# Patient Record
Sex: Female | Born: 1970 | Race: White | Hispanic: No | Marital: Married | Smoking: Never smoker
Health system: Southern US, Community
[De-identification: ages and names within clinical notes are randomized; demographics above are authoritative.]

## PROBLEM LIST (undated history)

## (undated) DIAGNOSIS — G43909 Migraine, unspecified, not intractable, without status migrainosus: Secondary | ICD-10-CM

## (undated) DIAGNOSIS — I493 Ventricular premature depolarization: Secondary | ICD-10-CM

## (undated) HISTORY — DX: Migraine, unspecified, not intractable, without status migrainosus: G43.909

## (undated) HISTORY — DX: Ventricular premature depolarization: I49.3

---
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
## 2000-08-17 ENCOUNTER — Encounter: Payer: Self-pay | Admitting: Internal Medicine

## 2000-08-17 ENCOUNTER — Encounter: Admission: RE | Admit: 2000-08-17 | Discharge: 2000-08-17 | Payer: Self-pay | Admitting: Internal Medicine

## 2000-10-09 ENCOUNTER — Ambulatory Visit (HOSPITAL_COMMUNITY): Admission: RE | Admit: 2000-10-09 | Discharge: 2000-10-09 | Payer: Self-pay | Admitting: Family Medicine

## 2000-10-10 ENCOUNTER — Encounter: Payer: Self-pay | Admitting: Family Medicine

## 2000-11-08 ENCOUNTER — Encounter: Payer: Self-pay | Admitting: Family Medicine

## 2000-11-08 ENCOUNTER — Ambulatory Visit (HOSPITAL_COMMUNITY): Admission: RE | Admit: 2000-11-08 | Discharge: 2000-11-08 | Payer: Self-pay | Admitting: Family Medicine

## 2001-01-07 ENCOUNTER — Ambulatory Visit (HOSPITAL_COMMUNITY): Admission: RE | Admit: 2001-01-07 | Discharge: 2001-01-07 | Payer: Self-pay | Admitting: Gastroenterology

## 2001-01-07 ENCOUNTER — Encounter (INDEPENDENT_AMBULATORY_CARE_PROVIDER_SITE_OTHER): Payer: Self-pay

## 2001-02-06 ENCOUNTER — Ambulatory Visit (HOSPITAL_COMMUNITY): Admission: RE | Admit: 2001-02-06 | Discharge: 2001-02-06 | Payer: Self-pay | Admitting: Gastroenterology

## 2001-02-06 ENCOUNTER — Encounter: Payer: Self-pay | Admitting: Gastroenterology

## 2001-03-28 ENCOUNTER — Other Ambulatory Visit: Admission: RE | Admit: 2001-03-28 | Discharge: 2001-03-28 | Payer: Self-pay | Admitting: Obstetrics and Gynecology

## 2001-09-17 ENCOUNTER — Other Ambulatory Visit: Admission: RE | Admit: 2001-09-17 | Discharge: 2001-09-17 | Payer: Self-pay | Admitting: Obstetrics and Gynecology

## 2002-09-25 ENCOUNTER — Other Ambulatory Visit: Admission: RE | Admit: 2002-09-25 | Discharge: 2002-09-25 | Payer: Self-pay | Admitting: Obstetrics and Gynecology

## 2003-03-31 ENCOUNTER — Inpatient Hospital Stay (HOSPITAL_COMMUNITY): Admission: AD | Admit: 2003-03-31 | Discharge: 2003-04-03 | Payer: Self-pay | Admitting: Obstetrics and Gynecology

## 2003-05-15 ENCOUNTER — Other Ambulatory Visit: Admission: RE | Admit: 2003-05-15 | Discharge: 2003-05-15 | Payer: Self-pay | Admitting: Obstetrics and Gynecology

## 2004-06-07 ENCOUNTER — Other Ambulatory Visit: Admission: RE | Admit: 2004-06-07 | Discharge: 2004-06-07 | Payer: Self-pay | Admitting: Obstetrics and Gynecology

## 2004-10-11 ENCOUNTER — Ambulatory Visit: Payer: Self-pay

## 2004-12-06 ENCOUNTER — Other Ambulatory Visit: Admission: RE | Admit: 2004-12-06 | Discharge: 2004-12-06 | Payer: Self-pay | Admitting: Obstetrics and Gynecology

## 2005-07-04 ENCOUNTER — Inpatient Hospital Stay (HOSPITAL_COMMUNITY): Admission: AD | Admit: 2005-07-04 | Discharge: 2005-07-04 | Payer: Self-pay | Admitting: Obstetrics and Gynecology

## 2005-07-06 ENCOUNTER — Inpatient Hospital Stay (HOSPITAL_COMMUNITY): Admission: AD | Admit: 2005-07-06 | Discharge: 2005-07-08 | Payer: Self-pay | Admitting: Obstetrics and Gynecology

## 2005-07-21 ENCOUNTER — Inpatient Hospital Stay (HOSPITAL_COMMUNITY): Admission: AD | Admit: 2005-07-21 | Discharge: 2005-07-21 | Payer: Self-pay | Admitting: Obstetrics and Gynecology

## 2006-10-10 ENCOUNTER — Ambulatory Visit: Payer: Self-pay | Admitting: Vascular Surgery

## 2007-03-06 ENCOUNTER — Ambulatory Visit (HOSPITAL_COMMUNITY): Admission: RE | Admit: 2007-03-06 | Discharge: 2007-03-06 | Payer: Self-pay | Admitting: Cardiology

## 2007-03-06 ENCOUNTER — Encounter: Payer: Self-pay | Admitting: Cardiology

## 2007-03-06 ENCOUNTER — Ambulatory Visit: Payer: Self-pay | Admitting: Cardiology

## 2007-09-10 ENCOUNTER — Ambulatory Visit (HOSPITAL_COMMUNITY): Admission: RE | Admit: 2007-09-10 | Discharge: 2007-09-10 | Payer: Self-pay | Admitting: Obstetrics and Gynecology

## 2007-10-09 ENCOUNTER — Ambulatory Visit (HOSPITAL_COMMUNITY): Admission: RE | Admit: 2007-10-09 | Discharge: 2007-10-09 | Payer: Self-pay | Admitting: Obstetrics and Gynecology

## 2008-03-02 ENCOUNTER — Inpatient Hospital Stay (HOSPITAL_COMMUNITY): Admission: RE | Admit: 2008-03-02 | Discharge: 2008-03-04 | Payer: Self-pay | Admitting: Obstetrics and Gynecology

## 2008-03-02 ENCOUNTER — Encounter (INDEPENDENT_AMBULATORY_CARE_PROVIDER_SITE_OTHER): Payer: Self-pay | Admitting: Obstetrics and Gynecology

## 2008-03-27 ENCOUNTER — Ambulatory Visit: Admission: RE | Admit: 2008-03-27 | Discharge: 2008-03-27 | Payer: Self-pay | Admitting: Obstetrics and Gynecology

## 2008-11-18 ENCOUNTER — Ambulatory Visit: Payer: Self-pay | Admitting: Vascular Surgery

## 2009-02-24 ENCOUNTER — Ambulatory Visit: Payer: Self-pay | Admitting: Vascular Surgery

## 2009-04-07 ENCOUNTER — Ambulatory Visit: Payer: Self-pay | Admitting: Vascular Surgery

## 2010-05-17 LAB — CBC
HCT: 29.6 % — ABNORMAL LOW (ref 36.0–46.0)
HCT: 40.2 % (ref 36.0–46.0)
Hemoglobin: 10.1 g/dL — ABNORMAL LOW (ref 12.0–15.0)
Hemoglobin: 13.7 g/dL (ref 12.0–15.0)
MCHC: 34.1 g/dL (ref 30.0–36.0)
MCHC: 34.3 g/dL (ref 30.0–36.0)
MCV: 97.3 fL (ref 78.0–100.0)
MCV: 97.4 fL (ref 78.0–100.0)
Platelets: 193 10*3/uL (ref 150–400)
Platelets: 255 10*3/uL (ref 150–400)
RBC: 3.04 MIL/uL — ABNORMAL LOW (ref 3.87–5.11)
RBC: 4.13 MIL/uL (ref 3.87–5.11)
RDW: 13.1 % (ref 11.5–15.5)
RDW: 13.3 % (ref 11.5–15.5)
WBC: 11.9 10*3/uL — ABNORMAL HIGH (ref 4.0–10.5)
WBC: 12.9 10*3/uL — ABNORMAL HIGH (ref 4.0–10.5)

## 2010-05-17 LAB — CCBB MATERNAL DONOR DRAW

## 2010-05-17 LAB — RPR: RPR Ser Ql: NONREACTIVE

## 2010-06-14 NOTE — Assessment & Plan Note (Signed)
OFFICE VISIT   Gartner, Sheena Williams  DOB:  [birth year]                                       11/18/2008  CHART#:[id number]   CHIEF COMPLAINT:  Pain in her right leg with venous pathology.   The patient is a 40 year old white female who I had seen in September of  2008.  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  These also extended up onto her  right labia.  These had resolved with each pregnancy.  She is now  approximately 8 months postpartum from her third child.  She reports  that initially immediately following delivery she had some improvement  but has now had worsening pain and swelling in her right posterior calf  extending down into her thigh.  She denies any history of deep venous  thrombosis or superficial thrombophlebitis.  She is not having any left  leg symptoms.   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  Does not smoke or drink alcohol.  She is married with two  children.   REVIEW OF SYSTEMS:  Positive for palpitations with PVCs, mitral valve  prolapse, otherwise negative.   PHYSICAL EXAM:  A well-developed, well-nourished white female appearing  her stated age of 70.  Her blood pressure is 117/84, pulse 81,  respirations 18.  Her dorsalis pedis pulses are 2+ bilaterally.  She  does not have any swelling today.  She does have an extensive small  varicosity in her right posterior thigh that runs the entire length from  her medial near the level of her labia down to the lateral calf at the  level just below the knee.  She reports tenderness over this area and  extending down into her calf and this has become progressive with  prolonged standing.   She underwent imaging by myself showing a small diameter great saphenous  vein with no evidence of reflux.  On imaging her saphenofemoral junction  I do not see any communication with the  varicosity of concern in her  saphenofemoral junction.  I discussed options with the patient.  She did  wear compression garments during her pregnancy which were panty style  and did give her some improvement.  She has not worn compression  garments following delivery.  She was fitted today with thigh-high 20-30  mmHg graduated compression stockings and will begin their use.  We will  see her again in 3 months to continue discussion regarding optimal  treatment.   Larina Earthly, M.D.  Electronically Signed   TFE/MEDQ  D:  11/18/2008  T:  11/19/2008  Job:  [id number]

## 2010-06-14 NOTE — Consult Note (Signed)
NEW PATIENT CONSULTATION   Sheena, Loree Williams  DOB:  [birth year]                                       10/10/2006  CHART#:[id number]   REFERRING PHYSICIAN:  Dario Guardian, MD   HISTORY OF PRESENT ILLNESS:  Sheena Williams presents today for  evaluation of bilateral venous pathology.  She is a very healthy, 40-  year-old white female, who during her first 2 pregnancies had issues  regarding labial varicosities.  These resolved after each pregnancy and  have not caused a great deal of difficulty.  She does report that she  does have some bulges in these areas with some swelling in the second  and third day of her menstrual cycle.  She reports this is a mild  nuisance only and is not causing her any specific discomfort.  She also  had what sounds like marked reticular varicosities in the posterior  thighs bilaterally at the time of her late pregnancy as well.  This  issue has completely resolved.  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  She reports that these occurred soon after  striking this with a softball as a teenager.  She does not have any  history of deep venous thrombosis or superficial thrombophlebitis.  Medical history is otherwise unremarkable.  She does have a history of  myocardial infarction and death at a young age for her father.  Mother  is significant for venous varicosities and peripheral vascular occlusive  disease.   SOCIAL HISTORY:  She is married with 2 children.  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  She does not smoke and has rare social  alcohol consumption.   REVIEW OF SYSTEMS:  Positive only for palpitations with PVCs, mitral  valve prolapse.   PHYSICAL EXAMINATION:  She is well-developed, well-nourished white  female, appearing her stated age of 51.  Weight is 125 pounds.  She is  65 inches tall.  Blood pressure is 104/66, pulse 64, respirations 16.  Dorsalis pedis pulses are 2+  bilaterally.  She does have reticular  varicosities over the lateral aspect of her thigh on the left.  She  reports that she does not have any labial varicosities currently, and I  did not examine this.  Her ankles are without swelling.   She underwent imaging of her saphenous vein by me with a screening  study, and these showed no evidence of dilatation of her saphenous veins  bilaterally.   I had a long discussion with Ms. Falotico.  I explained that with  subsequent pregnancies she will have continued bulging of these  varicosities later during her pregnancy.  I explained that this should  cause no risk and should resolve completely after delivery, as it has  each other time.  I explained the only treatment for these would be  sclerotherapy when she is not pregnant.  Apparently, she has seen an  outlying vein clinic; and they have recommended treatment with laser  ablation of her saphenous vein.  On cursory exam, I do not demonstrate  any gross reflux and she does not have enlarged saphenous veins.  I  explained that we are certainly much more conservative than this, since  she is not having any significant difficulty at this time, and would not  recommend such aggressive treatment to prevent potential  mild problems  with labial varicosities at the time of her delivery.  She was reassured  with this discussion and will see [place] again on an as needed basis.  I did  explain that if there was an issue regarding cosmetic involvement of the  reticular varicosities on the lateral aspect of her thigh, these could  be treated with sclerotherapy.  She is not concerned regarding this and  will see [place] again on an as needed basis.   Larina Earthly, M.D.  Electronically Signed   TFE/MEDQ  D:  10/10/2006  T:  10/11/2006  Job:  [id number]   cc:   Juluis Mire, M.D.  Dario Guardian, M.D.

## 2010-06-14 NOTE — Assessment & Plan Note (Signed)
OFFICE VISIT   Whitehair, Kristyana B  DOB:  12/16/1970                                       02/24/2009  CHART#:[id number]   The patient presents today for continued follow-up of her right leg  venous pathology.  I had seen her initially in September 2008 and  subsequently in October 2010.  She is now approximately 1 year  postpartum with her 3rd child.  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  She also reports this causes some difficulty with pain and  discomfort when squatting in household chores and childcare with  prolonged standing as well.  She also reports some continued labial  pressure and pain and swelling around her menstrual cycle.   Her past history and review of systems are otherwise unchanged.  She is  otherwise healthy.   PHYSICAL EXAMINATION:  She does have a have a tributary varicosity over  the area mentioned.  I feel that she has failed conservative treatment.  She does elevate when possible, does wear 20-30 mmHg compression  stockings and takes Advil for discomfort.  Despite this, she is still  having pain.  I explained the option of stab phlebectomy of her  reticular tributary varicosities and also the option of sclerotherapy.  I feel that her best result would be with sclerotherapy over these  areas.  She reports that the labial pressure is tolerable and is worse  with menstrual cycle.  I have recommended sclerotherapy and explained  the procedure, including a very slight risk of DVT and also some  possibility of staining over the area.  She understands and wishes to  proceed.     Larina Earthly, M.D.  Electronically Signed   TFE/MEDQ  D:   02/24/2009  T:  02/25/2009  Job:  [id number]   cc:   Dario Guardian, M.D.

## 2010-06-14 NOTE — Assessment & Plan Note (Signed)
OFFICE VISIT   Sheena Williams, Sheena Williams  DOB:  April 07, 1970                                       04/07/2009  CHART#:[id number]   The patient presents today for treatment of her painful reticular veins  on the posterior aspect of her right thigh.  She had a total of 6 mL of  0.3% polidocanol mixed 50% with CO2 air over the area from the lateral  aspect of her right popliteal space across the posterior thigh up to the  medial perineum.  She had no immediate complication and will be seen  again in one week for followup.     Larina Earthly, M.D.  Electronically Signed   TFE/MEDQ  D:  04/07/2009  T:  04/08/2009  Job:  [id number]

## 2010-06-17 NOTE — Procedures (Signed)
[facility]  Patient:    Sheena Williams, Sheena Williams Visit Number: [id number] MRN: [id number]          Service Type: END Location: ENDO Attending Physician:  Louie Bun Dictated by:   Everardo All Madilyn Fireman, M.D. Proc. Date: 01/07/01 Admit Date:  01/07/2001   CC:         Dario Guardian, M.D.   Procedure Report  PROCEDURE:  Colonoscopy with biopsy.  INDICATION FOR PROCEDURE:  Recurrent abdominal pain with a CT scan showing thickening of the cecum and terminal ileum and possibly sigmoid colon, suggestive of inflammatory bowel disease.  DESCRIPTION OF PROCEDURE:  The patient was placed in the left lateral decubitus position and placed on the pulse monitor with continuous low-flow oxygen delivered by nasal cannula.  She was sedated with 100 mg of IV Demerol and 10 mg of IV Versed.  The Olympus video colonoscope was inserted into the rectum and advanced to the cecum, confirmed by transillumination at McBurneys point and visualization of the ileocecal valve and on one or two occasions, I could see the appendiceal orifice at a distance.  The prep was good.  I could not get the scope to pass deeply into the cecum despite multiple torquing maneuvers, abdominal pressure, and multiple position changes including the prone position, and this hindered more detailed inspection of the cecum and intubation of the terminal ileum which I attempted from a distance but was unsuccessful in doing.  It appeared to me that the ileocecal valve was somewhat generous in appearance with some fine granularity, but this was of very questionable clinical significance.  I could not see the proximal side of the valve due to inability to intubate the cecum deeply, but the mucosa visualized in the cecum appeared normal other than a slightly generously sized ileocecal valve.  The ileocecal valve was biopsied.  The ascending, transverse, descending, sigmoid, and rectum all appeared normal with  no mucosal abnormalities, masses, polyps, or diverticula.  Specifically, there was no visible inflammation in the sigmoid colon where some thickening was suggested by CT.  The rectum likewise appeared normal, and retroflex view of the anus revealed on obvious internal hemorrhoids.  The colonoscope was then withdrawn, and the patient returned to the recovery room in stable condition. She tolerated the procedure well, and there were no immediate complications.  IMPRESSION:  No specific abnormality other than slightly enlarged ileocecal valve of very questionable significance with inspection of the base of the cecum somewhat limited by inability to advance the scope all the way into that area.  PLAN:  Given the periumbilical nature of her pain, we will consider a Meckels scan.  Will also await biopsy results.  Consider barium enema, small bowel series, etc. Dictated by:   Everardo All. Madilyn Fireman, M.D. Attending Physician:  Louie Bun DD:  01/07/01 TD:  01/07/01 Job: [id number]

## 2010-06-17 NOTE — H&P (Signed)
Sheena Williams, Sheena Williams                        ACCOUNT NO.:  [id number]   MEDICAL RECORD NO.:  [id number]                   PATIENT TYPE:  INP   LOCATION:  9168                                 FACILITY:  [facility]   PHYSICIAN:  Guy Sandifer. Arleta Creek, M.D.           DATE OF BIRTH:  Dec 07, 1970   DATE OF ADMISSION:  03/31/2003  DATE OF DISCHARGE:                                HISTORY & PHYSICAL   CHIEF COMPLAINT:  Decreased fetal movement.   HISTORY OF PRESENT ILLNESS:  This patient is a 40 year old married white  female, G1, P0, with an EDC of April 05, 2003, that is confirmed by early  examination and second trimester ultrasound, placing her at 39-3/7 weeks.  Pregnancy has been complicated by history of LEEP procedure.  She initially  had a low-lying placenta which was noted to have resolved on ultrasound.  Today she presented complaining of decreased fetal movement.  Nonstress test  had accelerations and no decelerations but did not quite meet criteria for  reactive nonstress test.  Therefore, biophysical profile was obtained.  A  vertex infant with an estimated fetal weight of 7 pounds 7 ounces at the  45th percentile was noted.  Amniotic fluid index was 15.8 in the 63rd  percentile.  Placenta is anterior and grade II.  However, biophysical  profile was consistent with a 2/8.  There were brief breathing motions noted  but none that were sustained.   After discussion of the options, she is being admitted for a two-stage  induction of labor.  She denies headache, vision change, epigastric pain,  vaginal bleeding, or rupture of membranes.   PAST MEDICAL HISTORY:  Per prenatal History and Physical.   PAST SURGICAL HISTORY:  Per prenatal History and Physical.   FAMILY HISTORY:  Per prenatal History and Physical.   SOCIAL HISTORY:  The patient denies tobacco, alcohol, or drug abuse.   MEDICATIONS:  Prenatal vitamins.   ALLERGIES:  TETANUS VACCINE leading to flu-like syndrome.   LABORATORY DATA:  Blood type O positive, Rh antibody screen negative,  toxoplasmosis titer negative, RPR nonreactive, rubella titer positive,  hepatitis B surface antigen negative. Cystic fibrosis screen negative.  Gonorrhea and Chlamydia culture negative, and normal AFP second trimester  screen.   PHYSICAL EXAMINATION:  VITAL SIGNS:  Height 5 feet 5-1/2 inches, weight 156  pounds.  Blood pressure 118/72.  LUNGS: Clear to auscultation.  HEART:  Regular rate and rhythm.  BREASTS:  Not examined.  ABDOMEN:  Gravid, soft, nontender.  No epigastric tenderness.  EXTREMITIES:  1+ edema.  PELVIC:  Cervix is 2 cm dilated, 30% effaced, -1 station, and vertex.  NEUROLOGIC:  Deep tendon reflexes 3+ without clonus.   IMPRESSION:  1. Intrauterine pregnancy at 39-3/7 weeks.  2. Nonreassuring biophysical profile.   PLAN:  Two-stage induction of labor.  Guy Sandifer Arleta Creek, M.D.    JET/MEDQ  D:  03/31/2003  T:  03/31/2003  Job:  [id number]

## 2010-07-29 ENCOUNTER — Other Ambulatory Visit: Payer: Self-pay | Admitting: Obstetrics and Gynecology

## 2010-07-29 DIAGNOSIS — Z1231 Encounter for screening mammogram for malignant neoplasm of breast: Secondary | ICD-10-CM

## 2010-08-08 ENCOUNTER — Ambulatory Visit
Admission: RE | Admit: 2010-08-08 | Discharge: 2010-08-08 | Disposition: A | Discharge status: Home / Self Care | Payer: 59 | Source: Ambulatory Visit | Attending: Obstetrics and Gynecology | Admitting: Obstetrics and Gynecology

## 2010-08-08 DIAGNOSIS — Z1231 Encounter for screening mammogram for malignant neoplasm of breast: Secondary | ICD-10-CM

## 2010-12-26 ENCOUNTER — Other Ambulatory Visit: Payer: Self-pay | Admitting: *Deleted

## 2010-12-26 DIAGNOSIS — I341 Nonrheumatic mitral (valve) prolapse: Secondary | ICD-10-CM

## 2011-01-06 ENCOUNTER — Ambulatory Visit (HOSPITAL_COMMUNITY)
Admission: RE | Admit: 2011-01-06 | Discharge: 2011-01-06 | Disposition: A | Discharge status: Home / Self Care | Payer: 59 | Source: Ambulatory Visit | Attending: Cardiology | Admitting: Cardiology

## 2011-01-06 DIAGNOSIS — I341 Nonrheumatic mitral (valve) prolapse: Secondary | ICD-10-CM

## 2011-01-06 DIAGNOSIS — I059 Rheumatic mitral valve disease, unspecified: Secondary | ICD-10-CM | POA: Insufficient documentation

## 2011-01-06 NOTE — Progress Notes (Signed)
  Echocardiogram 2D Echocardiogram has been performed.  Sheena Williams 01/06/2011, 9:39 AM

## 2011-05-25 ENCOUNTER — Other Ambulatory Visit (HOSPITAL_COMMUNITY): Payer: Self-pay | Admitting: Obstetrics and Gynecology

## 2011-05-25 DIAGNOSIS — E041 Nontoxic single thyroid nodule: Secondary | ICD-10-CM

## 2011-05-29 ENCOUNTER — Other Ambulatory Visit (HOSPITAL_COMMUNITY): Payer: 59

## 2011-05-30 ENCOUNTER — Ambulatory Visit (HOSPITAL_COMMUNITY)
Admission: RE | Admit: 2011-05-30 | Discharge: 2011-05-30 | Disposition: A | Discharge status: Home / Self Care | Payer: 59 | Source: Ambulatory Visit | Attending: Obstetrics and Gynecology | Admitting: Obstetrics and Gynecology

## 2011-05-30 DIAGNOSIS — E041 Nontoxic single thyroid nodule: Secondary | ICD-10-CM

## 2011-05-30 DIAGNOSIS — E049 Nontoxic goiter, unspecified: Secondary | ICD-10-CM | POA: Insufficient documentation

## 2011-06-30 ENCOUNTER — Encounter (INDEPENDENT_AMBULATORY_CARE_PROVIDER_SITE_OTHER): Payer: Self-pay | Admitting: General Surgery

## 2011-07-05 ENCOUNTER — Ambulatory Visit (INDEPENDENT_AMBULATORY_CARE_PROVIDER_SITE_OTHER): Payer: Commercial Managed Care - PPO | Admitting: Surgery

## 2011-07-05 ENCOUNTER — Encounter (INDEPENDENT_AMBULATORY_CARE_PROVIDER_SITE_OTHER): Payer: Self-pay | Admitting: Surgery

## 2011-07-05 VITALS — BP 108/72 | HR 82 | Temp 98.4°F | Resp 14 | Ht 65.5 in | Wt 133.2 lb

## 2011-07-05 DIAGNOSIS — E042 Nontoxic multinodular goiter: Secondary | ICD-10-CM

## 2011-07-05 NOTE — Progress Notes (Signed)
Chief Complaint  Patient presents with  . New Evaluation    thyroid nodules - referral from Dr. Richardean Chimera    HISTORY: Patient is a 41 year old white female referred by her gynecologist with newly diagnosed thyroid nodules. These were found on routine physical examination in April. Patient subsequently underwent thyroid ultrasound showing a multinodular thyroid gland of normal size. Largest nodules measured 1.2 cm and 1.4 cm. No other worrisome findings were identified. Thyroid function tests were obtained and a TSH level was normal at 1.007.  Patient had a history of thyroid cyst diagnosed approximately 10 years ago by ultrasound. She has never taken thyroid medication. She has had no prior head or neck surgery other than tonsillectomy. There is no family history of thyroid disease. There is no family history of other endocrinopathy.  Past Medical History  Diagnosis Date  . Migraines   . PVC's (premature ventricular contractions)      No current outpatient prescriptions on file.     Allergies  Allergen Reactions  . Tetanus Toxoids      Family History  Problem Relation Age of Onset  . Heart disease Father   . Cancer Maternal Aunt     Breast  . Cancer Maternal Grandmother     Breast  . Cancer Paternal Grandmother     lung     History   Social History  . Marital Status: Married    Spouse Name: N/A    Number of Children: N/A  . Years of Education: N/A   Social History Main Topics  . Smoking status: Never Smoker   . Smokeless tobacco: Never Used  . Alcohol Use: Yes     rarely  . Drug Use: No  . Sexually Active:    Other Topics Concern  . None   Social History Narrative  . None     REVIEW OF SYSTEMS - PERTINENT POSITIVES ONLY: Denies tremor. Denies mass. Denies pain. Denies palpitations.  EXAM: Filed Vitals:   07/05/11 0855  BP: 108/72  Pulse: 82  Temp: 98.4 F (36.9 C)  Resp: 14    HEENT: normocephalic; pupils equal and reactive; sclerae  clear; dentition good; mucous membranes moist NECK:  Palpable nodule left upper lobe approximately 1 cm, smooth, mobile, nontender; right lobe without palpable abnormality; symmetric on extension; no palpable anterior or posterior cervical lymphadenopathy; no supraclavicular masses; no tenderness CHEST: clear to auscultation bilaterally without rales, rhonchi, or wheezes CARDIAC: regular rate and rhythm without significant murmur; peripheral pulses are full EXT:  non-tender without edema; no deformity NEURO: no gross focal deficits; no sign of tremor   LABORATORY RESULTS: See [facility] (CHL-Epic) for most recent results   RADIOLOGY RESULTS: See [facility] (CHL-Epic) for most recent results   IMPRESSION: Bilateral thyroid nodules, clinically asymptomatic  PLAN: The patient and I discussed all of the above findings and issues. I think her risk of malignancy is quite low. I have recommended followup in 6 months with ultrasound and physical examination and TSH level. There is no current indication for surgical resection. Patient is in agreement and will return in 6 months for scheduled followup.  Velora Heckler, MD, FACS General & Endocrine Surgery [facility]   Visit Diagnoses: 1. Multinodular goiter (nontoxic)     Primary Care Physician: Allean Found, MD, MD  GYN:  Dr. Richardean Chimera

## 2011-07-05 NOTE — Patient Instructions (Signed)
Thyroid Diseases Your thyroid is a butterfly-shaped gland in your neck. It is located just above your collarbone. It is one of your endocrine glands, which make hormones. The thyroid helps set your metabolism. Metabolism is how your body gets energy from the foods you eat.  Millions of people have thyroid diseases. Women experience thyroid problems more often than men. In fact, overactive thyroid problems (hyperthyroidism) occur in 1% of all women. If you have a thyroid disease, your body may use energy more slowly or quickly than it should.  Thyroid problems also include an immune disease where your body reacts against your thyroid gland (called thyroiditis). A different problem involves lumps and bumps (called nodules) that develop in the gland. The nodules are usually, but not always, noncancerous. THE MOST COMMON THYROID PROBLEMS AND CAUSES ARE DISCUSSED BELOW There are many causes for thyroid problems. Treatment depends upon the exact diagnosis and includes trying to reset your body's metabolism to a normal rate. Hyperthyroidism Too much thyroid hormone from an overactive thyroid gland is called hyperthyroidism. In hyperthyroidism, the body's metabolism speeds up. One of the most frequent forms of hyperthyroidism is known as Graves' disease. Graves' disease tends to run in families. Although Graves' is thought to be caused by a problem with the immune system, the exact nature of the genetic problem is unknown. Hypothyroidism Too little thyroid hormone from an underactive thyroid gland is called hypothyroidism. In hypothyroidism, the body's metabolism is slowed. Several things can cause this condition. Most causes affect the thyroid gland directly and hurt its ability to make enough hormone.  Rarely, there may be a pituitary gland tumor (located near the base of the brain). The tumor can block the pituitary from producing thyroid-stimulating hormone (TSH). Your body makes TSH to stimulate the thyroid  to work properly. If the pituitary does not make enough TSH, the thyroid fails to make enough hormones needed for good health. Whether the problem is caused by thyroid conditions or by the pituitary gland, the result is that the thyroid is not making enough hormones. Hypothyroidism causes many physical and mental processes to become sluggish. The body consumes less oxygen and produces less body heat. Thyroid Nodules A thyroid nodule is a small swelling or lump in the thyroid gland. They are common. These nodules represent either a growth of thyroid tissue or a fluid-filled cyst. Both form a lump in the thyroid gland. Almost half of all people will have tiny thyroid nodules at some point in their lives. Typically, these are not noticeable until they become large and affect normal thyroid size. Larger nodules that are greater than a half inch across (about 1 centimeter) occur in about 5 percent of people. Although most nodules are not cancerous, people who have them should seek medical care to rule out cancer. Also, some thyroid nodules may produce too much thyroid hormone or become too large. Large nodules or a large gland can interfere with breathing or swallowing or may cause neck discomfort. Other problems Other thyroid problems include cancer and thyroiditis. Thyroiditis is a malfunction of the body's immune system. Normally, the immune system works to defend the body against infection and other problems. When the immune system is not working properly, it may mistakenly attack normal cells, tissues, and organs. Examples of autoimmune diseases are Hashimoto's thyroiditis (which causes low thyroid function) and Graves' disease (which causes excess thyroid function). SYMPTOMS  Symptoms vary greatly depending upon the exact type of problem with the thyroid. Hyperthyroidism-is when your thyroid is too   active and makes more thyroid hormone than your body needs. The most common cause is Graves' Disease. Too  much thyroid hormone can cause some or all of the following symptoms:  Anxiety.   Irritability.   Difficulty sleeping.   Fatigue.   A rapid or irregular heartbeat.   A fine tremor of your hands or fingers.   An increase in perspiration.   Sensitivity to heat.   Weight loss, despite normal food intake.   Brittle hair.   Enlargement of your thyroid gland (goiter).   Light menstrual periods.   Frequent bowel movements.  Graves' disease can specifically cause eye and skin problems. The skin problems involve reddening and swelling of the skin, often on your shins and on the top of your feet. Eye problems can include the following:  Excess tearing and sensation of grit or sand in either or both eyes.   Reddened or inflamed eyes.   Widening of the space between your eyelids.   Swelling of the lids and tissues around the eyes.   Light sensitivity.   Ulcers on the cornea.   Double vision.   Limited eye movements.   Blurred or reduced vision.  Hypothyroidism- is when your thyroid gland is not active enough. This is more common than hyperthyroidism. Symptoms can vary a lot depending of the severity of the hormone deficiency. Symptoms may develop over a long period of time and can include several of the following:  Fatigue.   Sluggishness.   Increased sensitivity to cold.   Constipation.   Pale, dry skin.   A puffy face.   Hoarse voice.   High blood cholesterol level.   Unexplained weight gain.   Muscle aches, tenderness and stiffness.   Pain, stiffness or swelling in your joints.   Muscle weakness.   Heavier than normal menstrual periods.   Brittle fingernails and hair.   Depression.  Thyroid Nodules - most do not cause signs or symptoms. Occasionally, some may become so large that you can feel or even see the swelling at the base of your neck. You may realize a lump or swelling is there when you are shaving or putting on makeup. Men might become  aware of a nodule when shirt collars suddenly feel too tight. Some nodules produce too much thyroid hormone. This can produce the same symptoms as hyperthyroidism (see above). Thyroid nodules are seldom cancerous. However, a nodule is more likely to be malignant (cancerous) if it:  Grows quickly or feels hard.   Causes you to become hoarse or to have trouble swallowing or breathing.   Causes enlarged lymph nodes under your jaw or in your neck.  DIAGNOSIS  Because there are so many possible thyroid conditions, your caregiver may ask for a number of tests. They will do this in order to narrow down the exact diagnosis. These tests can include:  Blood and antibody tests.   Special thyroid scans using small, safe amounts of radioactive iodine.   Ultrasound of the thyroid gland (particularly if there is a nodule or lump).   Biopsy. This is usually done with a special needle. A needle biopsy is a procedure to obtain a sample of cells from the thyroid. The tissue will be tested in a lab and examined under a microscope.  TREATMENT  Treatment depends on the exact diagnosis. Hyperthyroidism  Beta-blockers help relieve many of the symptoms.   Anti-thyroid medications prevent the thyroid from making excess hormones.   Radioactive iodine treatment can destroy overactive thyroid   cells. The iodine can permanently decrease the amount of hormone produced.   Surgery to remove the thyroid gland.   Treatments for eye problems that come from Graves' disease also include medications and special eye surgery, if felt to be appropriate.  Hypothyroidism Thyroid replacement with levothyroxine is the mainstay of treatment. Treatment with thyroid replacement is usually lifelong and will require monitoring and adjustment from time to time. Thyroid Nodules  Watchful waiting. If a small nodule causes no symptoms or signs of cancer on biopsy, then no treatment may be chosen at first. Re-exam and re-checking blood  tests would be the recommended follow-up.   Anti-thyroid medications or radioactive iodine treatment may be recommended if the nodules produce too much thyroid hormone (see Treatment for Hyperthyroidism above).   Alcohol ablation. Injections of small amounts of ethyl alcohol (ethanol) can cause a non-cancerous nodule to shrink in size.   Surgery (see Treatment for Hyperthyroidism above).  HOME CARE INSTRUCTIONS   Take medications as instructed.   Follow through on recommended testing.  SEEK MEDICAL CARE IF:   You feel that you are developing symptoms of Hyperthyroidism or Hypothyroidism as described above.   You develop a new lump/nodule in the neck/thyroid area that you had not noticed before.   You feel that you are having side effects from medicines prescribed.   You develop trouble breathing or swallowing.  SEEK IMMEDIATE MEDICAL CARE IF:   You develop a fever of 102 F (38.9 C) or higher.   You develop severe sweating.   You develop palpitations and/or rapid heart beat.   You develop shortness of breath.   You develop nausea and vomiting.   You develop extreme shakiness.   You develop agitation.   You develop lightheadedness or have a fainting episode.  Document Released: 11/13/2006 Document Revised: 01/05/2011 Document Reviewed: 11/13/2006 [employer] Patient Information 2012 [employer]. 

## 2011-07-24 ENCOUNTER — Other Ambulatory Visit: Payer: Self-pay | Admitting: Obstetrics and Gynecology

## 2011-07-24 DIAGNOSIS — Z1231 Encounter for screening mammogram for malignant neoplasm of breast: Secondary | ICD-10-CM

## 2011-07-28 ENCOUNTER — Encounter (INDEPENDENT_AMBULATORY_CARE_PROVIDER_SITE_OTHER): Payer: Self-pay

## 2011-08-16 ENCOUNTER — Ambulatory Visit: Payer: 59

## 2011-08-18 ENCOUNTER — Ambulatory Visit
Admission: RE | Admit: 2011-08-18 | Discharge: 2011-08-18 | Disposition: A | Discharge status: Home / Self Care | Payer: 59 | Source: Ambulatory Visit | Attending: Obstetrics and Gynecology | Admitting: Obstetrics and Gynecology

## 2011-08-18 DIAGNOSIS — Z1231 Encounter for screening mammogram for malignant neoplasm of breast: Secondary | ICD-10-CM

## 2011-12-01 ENCOUNTER — Telehealth (INDEPENDENT_AMBULATORY_CARE_PROVIDER_SITE_OTHER): Payer: Self-pay

## 2011-12-01 NOTE — Telephone Encounter (Signed)
Lab slip reminder mailed to pt .

## 2012-01-02 ENCOUNTER — Ambulatory Visit (HOSPITAL_COMMUNITY)
Admission: RE | Admit: 2012-01-02 | Discharge: 2012-01-02 | Disposition: A | Discharge status: Home / Self Care | Payer: 59 | Source: Ambulatory Visit | Attending: Surgery | Admitting: Surgery

## 2012-01-02 DIAGNOSIS — E042 Nontoxic multinodular goiter: Secondary | ICD-10-CM | POA: Insufficient documentation

## 2012-01-04 ENCOUNTER — Other Ambulatory Visit (HOSPITAL_COMMUNITY): Payer: 59

## 2012-01-17 ENCOUNTER — Encounter (INDEPENDENT_AMBULATORY_CARE_PROVIDER_SITE_OTHER): Payer: Self-pay | Admitting: Surgery

## 2012-01-17 ENCOUNTER — Ambulatory Visit (INDEPENDENT_AMBULATORY_CARE_PROVIDER_SITE_OTHER): Payer: Commercial Managed Care - PPO | Admitting: Surgery

## 2012-01-17 VITALS — BP 108/66 | HR 72 | Temp 97.4°F | Resp 18 | Ht 65.5 in | Wt 133.2 lb

## 2012-01-17 DIAGNOSIS — E042 Nontoxic multinodular goiter: Secondary | ICD-10-CM

## 2012-01-17 NOTE — Progress Notes (Signed)
General Surgery [facility]  Visit Diagnoses: 1. Multinodular goiter (nontoxic)     HISTORY: Patient is a 41 year old white female evaluated in the summer of 2013 for newly diagnosed thyroid nodules. At my request she had a followup ultrasound performed on 01/02/2012. This shows a normal sized thyroid gland containing multiple small nodules. The dominant nodule in the right lobe is complex and measures 1.3 cm. The dominant nodule in the left lobe is primarily cystic in measures 1.2 cm. Both are stable. No worrisome findings are identified.  PERTINENT REVIEW OF SYSTEMS: Denies changes in self exam. Denies palpitations. Denies tremor. Denies compressive symptoms. Denies lymphadenopathy. Denies pain.  EXAM: HEENT: normocephalic; pupils equal and reactive; sclerae clear; dentition good; mucous membranes moist NECK:  Palpable smooth firm nodule mid the inferior portion of left thyroid lobe, mobile with swallowing; right lobe nodular without discrete or dominant mass; symmetric on extension; no palpable anterior or posterior cervical lymphadenopathy; no supraclavicular masses; no tenderness CHEST: clear to auscultation bilaterally without rales, rhonchi, or wheezes CARDIAC: regular rate and rhythm without significant murmur; peripheral pulses are full EXT:  non-tender without edema; no deformity NEURO: no gross focal deficits; no sign of tremor   IMPRESSION: Bilateral thyroid nodules, clinically stable  PLAN: Patient is reassured that her examination is benign. Nodules appear stable by ultrasound. We will repeat a thyroid ultrasound in one year. She has had a TSH level performed at her primary care physician's office. She will forward that result of this to include in her records.  Patient will return for physical examination in one year following her ultrasound study.  Velora Heckler, MD, [facility] Office: [phone]

## 2012-01-17 NOTE — Patient Instructions (Signed)

## 2012-05-27 ENCOUNTER — Other Ambulatory Visit: Payer: Self-pay | Admitting: Dermatology

## 2013-01-01 ENCOUNTER — Other Ambulatory Visit: Payer: 59

## 2013-01-13 ENCOUNTER — Encounter: Payer: Self-pay | Admitting: Interventional Cardiology

## 2013-01-13 ENCOUNTER — Ambulatory Visit (INDEPENDENT_AMBULATORY_CARE_PROVIDER_SITE_OTHER): Payer: 59 | Admitting: Interventional Cardiology

## 2013-01-13 VITALS — BP 118/84 | HR 65 | Ht 65.0 in | Wt 130.0 lb

## 2013-01-13 DIAGNOSIS — I4949 Other premature depolarization: Secondary | ICD-10-CM

## 2013-01-13 DIAGNOSIS — I493 Ventricular premature depolarization: Secondary | ICD-10-CM | POA: Insufficient documentation

## 2013-01-13 DIAGNOSIS — I471 Supraventricular tachycardia: Secondary | ICD-10-CM | POA: Insufficient documentation

## 2013-01-13 NOTE — Patient Instructions (Signed)
Your physician recommends that you continue on your current medications as directed. Please refer to the Current Medication list given to you today.   Your physician recommends that you schedule a follow-up appointment as needed  

## 2013-01-13 NOTE — Progress Notes (Addendum)
Patient ID: Sheena Williams, female   DOB: Nov 29, 1970, 42 y.o.   MRN: [id number]   Date: 01/13/2013 ID: Sheena Williams, DOB 1970/10/16, MRN [id number] PCP: Allean Found, MD  Reason: Palpitations  ASSESSMENT;  1. Premature ventricular complexes, previously documented, increase frequency recently 2. "History of mitral valve prolapse" without echo documentation of the same  PLAN:  1. Reassurance with reevaluation only if symptoms persist or worsen. 2. Check electrolyte levels.   SUBJECTIVE: Sheena Williams is a 42 y.o. female who is having increased palpitations. There been previously evaluated and found to be premature ventricular contractions. Over the past 2 weeks they have become distracting. EKG performed at work demonstrated frequent uniform ventricular ectopy. She is up and moving around the ectopy subsides. She has not had syncope or lightheadedness. She feels that there is a diagnosis of "mitral valve prolapse". 2 prior echoes and not demonstrated echocardiographic evidence of prolapse but rather flattened mitral valve closure. No significant MR is noted. She denies chest pain. No orthopnea PND. She has never had sustained tachycardia.   Allergies  Allergen Reactions  . Tetanus Toxoids Other (See Comments)    High fever for 48 hours.    No current outpatient prescriptions on file prior to visit.   No current facility-administered medications on file prior to visit.    Past Medical History  Diagnosis Date  . Migraines   . PVC's (premature ventricular contractions)     Past Surgical History  Procedure Laterality Date  . Tonsillectomy  1993    History   Social History  . Marital Status: Married    Spouse Name: N/A    Number of Children: N/A  . Years of Education: N/A   Occupational History  . Not on file.   Social History Main Topics  . Smoking status: Never Smoker   . Smokeless tobacco: Never Used  . Alcohol Use: Yes     Comment: rarely  .  Drug Use: No  . Sexual Activity: Not on file   Other Topics Concern  . Not on file   Social History Narrative  . No narrative on file    Family History  Problem Relation Age of Onset  . Heart disease Father   . Cancer Maternal Aunt     Breast  . Cancer Maternal Grandmother     Breast  . Cancer Paternal Grandmother     lung    ROS: Resting well. Denies stress and anxiety. External sources of catecholamines. Denies caffeine use. Is no smoke or drink. No edema. Appetite is been stable. No wheezing or. Other systems negative for complaints.  OBJECTIVE: BP 118/84  Pulse 65  Ht 5\' 5"  (1.651 m)  Wt 130 lb (58.968 kg)  BMI 21.63 kg/m2,  General: No acute distress, healthy appearing HEENT: normal without jaundice or pallor Neck: JVD flat. Carotids absent Chest: Clear Cardiac: Murmur: Absent. Gallop: Absent. Rhythm: Irregular due to premature beats. Other: Normal Abdomen: Bruit: Normal. Pulsation: Normal Extremities: Edema: Absent. Pulses: 2+ Neuro: Normal Psych: Normal  ECG: Normal. One PVC noted

## 2013-01-14 ENCOUNTER — Telehealth: Payer: Self-pay

## 2013-01-14 DIAGNOSIS — I493 Ventricular premature depolarization: Secondary | ICD-10-CM

## 2013-01-14 NOTE — Telephone Encounter (Signed)
Message copied by Jarvis Newcomer on Tue Jan 14, 2013  8:25 AM ------      Message from: Verdis Prime      Created: Mon Jan 13, 2013  6:04 PM       I saw her for PVC's.      She needs a BMET and magnesium level to finish w/u of PVC's ------

## 2013-01-20 ENCOUNTER — Other Ambulatory Visit: Payer: Self-pay

## 2013-01-20 DIAGNOSIS — Z1231 Encounter for screening mammogram for malignant neoplasm of breast: Secondary | ICD-10-CM

## 2013-01-24 ENCOUNTER — Ambulatory Visit
Admission: RE | Admit: 2013-01-24 | Discharge: 2013-01-24 | Disposition: A | Discharge status: Home / Self Care | Payer: 59 | Source: Ambulatory Visit

## 2013-01-24 DIAGNOSIS — Z1231 Encounter for screening mammogram for malignant neoplasm of breast: Secondary | ICD-10-CM

## 2013-06-17 ENCOUNTER — Other Ambulatory Visit (HOSPITAL_COMMUNITY): Payer: Self-pay | Admitting: Obstetrics and Gynecology

## 2013-06-17 DIAGNOSIS — E041 Nontoxic single thyroid nodule: Secondary | ICD-10-CM

## 2013-06-19 ENCOUNTER — Ambulatory Visit (HOSPITAL_COMMUNITY): Payer: 59

## 2013-06-19 ENCOUNTER — Ambulatory Visit (HOSPITAL_COMMUNITY)
Admission: RE | Admit: 2013-06-19 | Discharge: 2013-06-19 | Disposition: A | Discharge status: Home / Self Care | Payer: 59 | Source: Ambulatory Visit | Attending: Obstetrics and Gynecology | Admitting: Obstetrics and Gynecology

## 2013-06-19 DIAGNOSIS — E042 Nontoxic multinodular goiter: Secondary | ICD-10-CM | POA: Insufficient documentation

## 2013-06-19 DIAGNOSIS — E041 Nontoxic single thyroid nodule: Secondary | ICD-10-CM | POA: Insufficient documentation

## 2013-12-22 ENCOUNTER — Other Ambulatory Visit: Payer: Self-pay

## 2013-12-22 DIAGNOSIS — Z1231 Encounter for screening mammogram for malignant neoplasm of breast: Secondary | ICD-10-CM

## 2014-01-26 ENCOUNTER — Ambulatory Visit
Admission: RE | Admit: 2014-01-26 | Discharge: 2014-01-26 | Disposition: A | Discharge status: Home / Self Care | Payer: 59 | Source: Ambulatory Visit

## 2014-01-26 DIAGNOSIS — Z1231 Encounter for screening mammogram for malignant neoplasm of breast: Secondary | ICD-10-CM

## 2014-06-15 ENCOUNTER — Other Ambulatory Visit: Payer: Self-pay | Admitting: Obstetrics and Gynecology

## 2014-06-16 ENCOUNTER — Other Ambulatory Visit (HOSPITAL_COMMUNITY): Payer: Self-pay | Admitting: Obstetrics and Gynecology

## 2014-06-16 DIAGNOSIS — E049 Nontoxic goiter, unspecified: Secondary | ICD-10-CM

## 2014-06-16 LAB — CYTOLOGY - PAP

## 2014-06-18 ENCOUNTER — Ambulatory Visit (HOSPITAL_COMMUNITY): Payer: 59

## 2014-06-18 ENCOUNTER — Ambulatory Visit (HOSPITAL_COMMUNITY)
Admission: RE | Admit: 2014-06-18 | Discharge: 2014-06-18 | Disposition: A | Discharge status: Home / Self Care | Payer: 59 | Source: Ambulatory Visit | Attending: Obstetrics and Gynecology | Admitting: Obstetrics and Gynecology

## 2014-06-18 DIAGNOSIS — E049 Nontoxic goiter, unspecified: Secondary | ICD-10-CM

## 2014-06-18 DIAGNOSIS — E042 Nontoxic multinodular goiter: Secondary | ICD-10-CM | POA: Insufficient documentation

## 2014-12-17 ENCOUNTER — Other Ambulatory Visit: Payer: Self-pay

## 2014-12-17 DIAGNOSIS — Z1231 Encounter for screening mammogram for malignant neoplasm of breast: Secondary | ICD-10-CM

## 2015-01-28 ENCOUNTER — Ambulatory Visit
Admission: RE | Admit: 2015-01-28 | Discharge: 2015-01-28 | Disposition: A | Discharge status: Home / Self Care | Payer: 59 | Source: Ambulatory Visit

## 2015-01-28 DIAGNOSIS — Z1231 Encounter for screening mammogram for malignant neoplasm of breast: Secondary | ICD-10-CM

## 2015-02-03 ENCOUNTER — Other Ambulatory Visit: Payer: Self-pay | Admitting: Obstetrics and Gynecology

## 2015-02-03 DIAGNOSIS — D225 Melanocytic nevi of trunk: Secondary | ICD-10-CM | POA: Diagnosis not present

## 2015-02-03 DIAGNOSIS — Z86018 Personal history of other benign neoplasm: Secondary | ICD-10-CM | POA: Diagnosis not present

## 2015-02-03 DIAGNOSIS — D1801 Hemangioma of skin and subcutaneous tissue: Secondary | ICD-10-CM | POA: Diagnosis not present

## 2015-02-03 DIAGNOSIS — R928 Other abnormal and inconclusive findings on diagnostic imaging of breast: Secondary | ICD-10-CM

## 2015-02-03 DIAGNOSIS — D485 Neoplasm of uncertain behavior of skin: Secondary | ICD-10-CM | POA: Diagnosis not present

## 2015-02-04 ENCOUNTER — Ambulatory Visit
Admission: RE | Admit: 2015-02-04 | Discharge: 2015-02-04 | Disposition: A | Discharge status: Home / Self Care | Payer: 59 | Source: Ambulatory Visit | Attending: Obstetrics and Gynecology | Admitting: Obstetrics and Gynecology

## 2015-02-04 DIAGNOSIS — R922 Inconclusive mammogram: Secondary | ICD-10-CM | POA: Diagnosis not present

## 2015-02-04 DIAGNOSIS — N6489 Other specified disorders of breast: Secondary | ICD-10-CM | POA: Diagnosis not present

## 2015-02-04 DIAGNOSIS — R928 Other abnormal and inconclusive findings on diagnostic imaging of breast: Secondary | ICD-10-CM

## 2015-04-26 DIAGNOSIS — Z0001 Encounter for general adult medical examination with abnormal findings: Secondary | ICD-10-CM | POA: Diagnosis not present

## 2015-04-26 DIAGNOSIS — R6889 Other general symptoms and signs: Secondary | ICD-10-CM | POA: Diagnosis not present

## 2015-04-26 DIAGNOSIS — J101 Influenza due to other identified influenza virus with other respiratory manifestations: Secondary | ICD-10-CM | POA: Diagnosis not present

## 2015-04-26 DIAGNOSIS — Z13220 Encounter for screening for lipoid disorders: Secondary | ICD-10-CM | POA: Diagnosis not present

## 2015-04-26 MED FILL — OSELTAMIVIR PHOS 75 MG CAP: 75 | 5 days supply | Qty: 10 | Fill #0

## 2015-06-17 DIAGNOSIS — Z01419 Encounter for gynecological examination (general) (routine) without abnormal findings: Secondary | ICD-10-CM | POA: Diagnosis not present

## 2015-06-17 DIAGNOSIS — Z6821 Body mass index (BMI) 21.0-21.9, adult: Secondary | ICD-10-CM | POA: Diagnosis not present

## 2015-07-07 DIAGNOSIS — H5213 Myopia, bilateral: Secondary | ICD-10-CM | POA: Diagnosis not present

## 2015-11-15 DIAGNOSIS — B349 Viral infection, unspecified: Secondary | ICD-10-CM | POA: Diagnosis not present

## 2015-11-23 MED FILL — VENTOLIN HFA 90 MCG INHALER: 108 (90 BAS | 25 days supply | Qty: 18 | Fill #0

## 2015-12-16 DIAGNOSIS — L309 Dermatitis, unspecified: Secondary | ICD-10-CM | POA: Diagnosis not present

## 2016-02-11 ENCOUNTER — Other Ambulatory Visit: Payer: Self-pay | Admitting: Obstetrics and Gynecology

## 2016-02-11 DIAGNOSIS — Z23 Encounter for immunization: Secondary | ICD-10-CM | POA: Diagnosis not present

## 2016-02-11 DIAGNOSIS — D485 Neoplasm of uncertain behavior of skin: Secondary | ICD-10-CM | POA: Diagnosis not present

## 2016-02-11 DIAGNOSIS — L821 Other seborrheic keratosis: Secondary | ICD-10-CM | POA: Diagnosis not present

## 2016-02-11 DIAGNOSIS — Z1231 Encounter for screening mammogram for malignant neoplasm of breast: Secondary | ICD-10-CM

## 2016-02-11 DIAGNOSIS — D2271 Melanocytic nevi of right lower limb, including hip: Secondary | ICD-10-CM | POA: Diagnosis not present

## 2016-02-11 DIAGNOSIS — Z86018 Personal history of other benign neoplasm: Secondary | ICD-10-CM | POA: Diagnosis not present

## 2016-02-11 DIAGNOSIS — D2272 Melanocytic nevi of left lower limb, including hip: Secondary | ICD-10-CM | POA: Diagnosis not present

## 2016-02-11 DIAGNOSIS — D225 Melanocytic nevi of trunk: Secondary | ICD-10-CM | POA: Diagnosis not present

## 2016-02-11 DIAGNOSIS — D1800 Hemangioma unspecified site: Secondary | ICD-10-CM | POA: Diagnosis not present

## 2016-02-11 DIAGNOSIS — D1801 Hemangioma of skin and subcutaneous tissue: Secondary | ICD-10-CM | POA: Diagnosis not present

## 2016-03-07 DIAGNOSIS — J309 Allergic rhinitis, unspecified: Secondary | ICD-10-CM | POA: Diagnosis not present

## 2016-03-07 MED FILL — AZELASTINE HCL 137 MCG SPRY: 0.1 | 30 days supply | Qty: 30 | Fill #0

## 2016-03-09 ENCOUNTER — Ambulatory Visit
Admission: RE | Admit: 2016-03-09 | Discharge: 2016-03-09 | Disposition: A | Discharge status: Home / Self Care | Payer: 59 | Source: Ambulatory Visit | Attending: Obstetrics and Gynecology | Admitting: Obstetrics and Gynecology

## 2016-03-09 DIAGNOSIS — Z1231 Encounter for screening mammogram for malignant neoplasm of breast: Secondary | ICD-10-CM | POA: Diagnosis not present

## 2016-04-05 DIAGNOSIS — Z13220 Encounter for screening for lipoid disorders: Secondary | ICD-10-CM | POA: Diagnosis not present

## 2016-04-05 DIAGNOSIS — E041 Nontoxic single thyroid nodule: Secondary | ICD-10-CM | POA: Diagnosis not present

## 2016-04-05 DIAGNOSIS — R000 Tachycardia, unspecified: Secondary | ICD-10-CM | POA: Diagnosis not present

## 2016-04-05 DIAGNOSIS — Z8249 Family history of ischemic heart disease and other diseases of the circulatory system: Secondary | ICD-10-CM | POA: Diagnosis not present

## 2016-06-20 DIAGNOSIS — Z6821 Body mass index (BMI) 21.0-21.9, adult: Secondary | ICD-10-CM | POA: Diagnosis not present

## 2016-06-20 DIAGNOSIS — Z01419 Encounter for gynecological examination (general) (routine) without abnormal findings: Secondary | ICD-10-CM | POA: Diagnosis not present

## 2016-06-21 ENCOUNTER — Other Ambulatory Visit (HOSPITAL_COMMUNITY): Payer: Self-pay | Admitting: Obstetrics and Gynecology

## 2016-06-21 DIAGNOSIS — Z8639 Personal history of other endocrine, nutritional and metabolic disease: Secondary | ICD-10-CM

## 2016-06-27 ENCOUNTER — Ambulatory Visit (HOSPITAL_COMMUNITY): Payer: 59

## 2016-07-24 ENCOUNTER — Ambulatory Visit (HOSPITAL_COMMUNITY)
Admission: RE | Admit: 2016-07-24 | Discharge: 2016-07-24 | Disposition: A | Discharge status: Home / Self Care | Payer: 59 | Source: Ambulatory Visit | Attending: Obstetrics and Gynecology | Admitting: Obstetrics and Gynecology

## 2016-07-24 DIAGNOSIS — E042 Nontoxic multinodular goiter: Secondary | ICD-10-CM | POA: Insufficient documentation

## 2016-07-24 DIAGNOSIS — E041 Nontoxic single thyroid nodule: Secondary | ICD-10-CM | POA: Diagnosis not present

## 2016-07-24 DIAGNOSIS — Z8639 Personal history of other endocrine, nutritional and metabolic disease: Secondary | ICD-10-CM

## 2016-08-30 DIAGNOSIS — H5213 Myopia, bilateral: Secondary | ICD-10-CM | POA: Diagnosis not present

## 2016-09-22 DIAGNOSIS — S335XXA Sprain of ligaments of lumbar spine, initial encounter: Secondary | ICD-10-CM | POA: Diagnosis not present

## 2016-09-22 MED FILL — NAPROXEN 500 MG TABLET: 500 | 30 days supply | Qty: 60 | Fill #0

## 2016-09-22 MED FILL — CYCLOBENZAPRINE 10 MG TAB: 10 | 10 days supply | Qty: 30 | Fill #0

## 2016-09-28 DIAGNOSIS — M549 Dorsalgia, unspecified: Secondary | ICD-10-CM | POA: Diagnosis not present

## 2016-09-28 MED FILL — predniSONE 50 MG TABS: 50 | 5 days supply | Qty: 5 | Fill #0

## 2016-09-29 ENCOUNTER — Ambulatory Visit (HOSPITAL_COMMUNITY)
Admission: RE | Admit: 2016-09-29 | Discharge: 2016-09-29 | Disposition: A | Discharge status: Home / Self Care | Payer: 59 | Source: Ambulatory Visit | Attending: Family Medicine | Admitting: Family Medicine

## 2016-09-29 ENCOUNTER — Other Ambulatory Visit (HOSPITAL_COMMUNITY): Payer: Self-pay | Admitting: Family Medicine

## 2016-09-29 DIAGNOSIS — M545 Low back pain, unspecified: Secondary | ICD-10-CM

## 2017-02-06 ENCOUNTER — Other Ambulatory Visit: Payer: Self-pay | Admitting: Obstetrics and Gynecology

## 2017-02-06 DIAGNOSIS — Z139 Encounter for screening, unspecified: Secondary | ICD-10-CM

## 2017-02-13 DIAGNOSIS — D225 Melanocytic nevi of trunk: Secondary | ICD-10-CM | POA: Diagnosis not present

## 2017-02-13 DIAGNOSIS — D2271 Melanocytic nevi of right lower limb, including hip: Secondary | ICD-10-CM | POA: Diagnosis not present

## 2017-02-13 DIAGNOSIS — D2272 Melanocytic nevi of left lower limb, including hip: Secondary | ICD-10-CM | POA: Diagnosis not present

## 2017-02-13 DIAGNOSIS — Z23 Encounter for immunization: Secondary | ICD-10-CM | POA: Diagnosis not present

## 2017-02-13 DIAGNOSIS — L570 Actinic keratosis: Secondary | ICD-10-CM | POA: Diagnosis not present

## 2017-02-13 DIAGNOSIS — I781 Nevus, non-neoplastic: Secondary | ICD-10-CM | POA: Diagnosis not present

## 2017-02-13 DIAGNOSIS — Z86018 Personal history of other benign neoplasm: Secondary | ICD-10-CM | POA: Diagnosis not present

## 2017-02-13 DIAGNOSIS — L821 Other seborrheic keratosis: Secondary | ICD-10-CM | POA: Diagnosis not present

## 2017-02-13 DIAGNOSIS — D1800 Hemangioma unspecified site: Secondary | ICD-10-CM | POA: Diagnosis not present

## 2017-03-08 DIAGNOSIS — D2261 Melanocytic nevi of right upper limb, including shoulder: Secondary | ICD-10-CM | POA: Diagnosis not present

## 2017-03-08 DIAGNOSIS — D485 Neoplasm of uncertain behavior of skin: Secondary | ICD-10-CM | POA: Diagnosis not present

## 2017-03-08 DIAGNOSIS — L570 Actinic keratosis: Secondary | ICD-10-CM | POA: Diagnosis not present

## 2017-03-12 ENCOUNTER — Ambulatory Visit
Admission: RE | Admit: 2017-03-12 | Discharge: 2017-03-12 | Disposition: A | Discharge status: Home / Self Care | Payer: 59 | Source: Ambulatory Visit | Attending: Obstetrics and Gynecology | Admitting: Obstetrics and Gynecology

## 2017-03-12 DIAGNOSIS — Z139 Encounter for screening, unspecified: Secondary | ICD-10-CM

## 2017-03-12 DIAGNOSIS — Z1231 Encounter for screening mammogram for malignant neoplasm of breast: Secondary | ICD-10-CM | POA: Diagnosis not present

## 2017-04-10 DIAGNOSIS — M25551 Pain in right hip: Secondary | ICD-10-CM | POA: Diagnosis not present

## 2017-04-10 DIAGNOSIS — M7631 Iliotibial band syndrome, right leg: Secondary | ICD-10-CM | POA: Diagnosis not present

## 2017-06-05 DIAGNOSIS — R194 Change in bowel habit: Secondary | ICD-10-CM | POA: Diagnosis not present

## 2017-06-05 DIAGNOSIS — Z0000 Encounter for general adult medical examination without abnormal findings: Secondary | ICD-10-CM | POA: Diagnosis not present

## 2017-06-05 DIAGNOSIS — Z13220 Encounter for screening for lipoid disorders: Secondary | ICD-10-CM | POA: Diagnosis not present

## 2017-06-13 DIAGNOSIS — K580 Irritable bowel syndrome with diarrhea: Secondary | ICD-10-CM | POA: Diagnosis not present

## 2017-06-21 DIAGNOSIS — Z6821 Body mass index (BMI) 21.0-21.9, adult: Secondary | ICD-10-CM | POA: Diagnosis not present

## 2017-06-21 DIAGNOSIS — Z01419 Encounter for gynecological examination (general) (routine) without abnormal findings: Secondary | ICD-10-CM | POA: Diagnosis not present

## 2017-06-28 ENCOUNTER — Other Ambulatory Visit (HOSPITAL_COMMUNITY): Payer: Self-pay | Admitting: Obstetrics and Gynecology

## 2017-06-28 DIAGNOSIS — Z803 Family history of malignant neoplasm of breast: Secondary | ICD-10-CM

## 2017-08-08 ENCOUNTER — Ambulatory Visit (HOSPITAL_COMMUNITY)
Admission: RE | Admit: 2017-08-08 | Discharge: 2017-08-08 | Disposition: A | Discharge status: Home / Self Care | Payer: 59 | Source: Ambulatory Visit | Attending: Obstetrics and Gynecology | Admitting: Obstetrics and Gynecology

## 2017-08-08 DIAGNOSIS — N6312 Unspecified lump in the right breast, upper inner quadrant: Secondary | ICD-10-CM | POA: Diagnosis not present

## 2017-08-08 DIAGNOSIS — Z803 Family history of malignant neoplasm of breast: Secondary | ICD-10-CM | POA: Diagnosis not present

## 2017-08-08 MED ORDER — GADOBENATE DIMEGLUMINE 529 MG/ML IV SOLN
15.0000 mL | Freq: Once | INTRAVENOUS | Status: AC | PRN
  Administered 2017-08-08: 12 mL via INTRAVENOUS

## 2017-08-10 ENCOUNTER — Other Ambulatory Visit: Payer: Self-pay | Admitting: Obstetrics and Gynecology

## 2017-08-10 DIAGNOSIS — N6310 Unspecified lump in the right breast, unspecified quadrant: Secondary | ICD-10-CM

## 2017-08-14 ENCOUNTER — Other Ambulatory Visit: Payer: 59

## 2017-08-14 ENCOUNTER — Ambulatory Visit
Admission: RE | Admit: 2017-08-14 | Discharge: 2017-08-14 | Disposition: A | Discharge status: Home / Self Care | Payer: 59 | Source: Ambulatory Visit | Attending: Obstetrics and Gynecology | Admitting: Obstetrics and Gynecology

## 2017-08-14 ENCOUNTER — Other Ambulatory Visit: Payer: Self-pay | Admitting: Obstetrics and Gynecology

## 2017-08-14 DIAGNOSIS — N6310 Unspecified lump in the right breast, unspecified quadrant: Secondary | ICD-10-CM

## 2017-08-14 DIAGNOSIS — N6011 Diffuse cystic mastopathy of right breast: Secondary | ICD-10-CM | POA: Diagnosis not present

## 2017-09-14 DIAGNOSIS — H33301 Unspecified retinal break, right eye: Secondary | ICD-10-CM | POA: Diagnosis not present

## 2017-10-31 DIAGNOSIS — K580 Irritable bowel syndrome with diarrhea: Secondary | ICD-10-CM | POA: Diagnosis not present

## 2018-01-16 ENCOUNTER — Encounter: Payer: Self-pay | Admitting: Physician Assistant

## 2018-01-16 ENCOUNTER — Ambulatory Visit (INDEPENDENT_AMBULATORY_CARE_PROVIDER_SITE_OTHER): Payer: Self-pay | Admitting: Physician Assistant

## 2018-01-16 VITALS — BP 112/70 | HR 89 | Temp 98.7°F | Resp 14 | Wt 137.2 lb

## 2018-01-16 DIAGNOSIS — J209 Acute bronchitis, unspecified: Secondary | ICD-10-CM

## 2018-01-16 DIAGNOSIS — R062 Wheezing: Secondary | ICD-10-CM

## 2018-01-16 MED ORDER — ALBUTEROL SULFATE 108 (90 BASE) MCG/ACT IN AEPB: 2.0000 | INHALATION_SPRAY | RESPIRATORY_TRACT | 0 refills | Status: DC

## 2018-01-16 MED ORDER — ALBUTEROL SULFATE (2.5 MG/3ML) 0.083% IN NEBU: 2.5000 mg | INHALATION_SOLUTION | Freq: Once | RESPIRATORY_TRACT | Status: AC

## 2018-01-16 MED ORDER — MUCINEX DM MAXIMUM STRENGTH 60-1200 MG PO TB12: 1.0000 | ORAL_TABLET | Freq: Two times a day (BID) | ORAL | 0 refills | Status: DC

## 2018-01-16 MED FILL — VENTOLIN HFA 90 MCG INHALER: 108 (90 BAS | 17 days supply | Qty: 18 | Fill #0

## 2018-01-16 NOTE — Progress Notes (Signed)
MRN: [id number] DOB: 1971/01/09  Subjective:   Sheena Williams is a 47 y.o. female presenting for chief complaint of cough and chest congestion (x6days) .  Reports 6 day history of cough and chest congestion. Cough has just become productive. Has had subjective fever, chills, wheezing, and ear fullness. Cough has started to improve but wheezing is worse. Denies chest pain, SOB, lower leg swelling,  sinus pain, rhinorrhea, itchy watery eyes, sore throat and myalgia, nausea, vomiting, abdominal pain and diarrhea. Has tried tylenol, ibuprofen, and mucinex with no full relief. Sick exposure to children who have similar symptoms. PMH of PVCs. No PMH of allergies, asthma, COPD, DM, thyroid disorder, or HIV. Patient has had flu shot this season. Denies smoking. Denies any other aggravating or relieving factors, no other questions or concerns.  ROS per HPI  Sheena Williams has a current medication list which includes the following prescription(s): albuterol sulfate and mucinex dm maximum strength, and the following Facility-Administered Medications: albuterol. Also is allergic to tetanus toxoids.  Sheena Williams  has a past medical history of Migraines and PVC's (premature ventricular contractions). Also  has a past surgical history that includes Tonsillectomy (1993).   Objective:   Vitals: BP 112/70   Pulse 89   Temp 98.7 F (37.1 C)   Resp 14   Wt 137 lb 3.2 oz (62.2 kg)   SpO2 98%   BMI 22.83 kg/m    Physical Exam Vitals signs reviewed.  Constitutional:      General: She is not in acute distress.    Appearance: Normal appearance. She is well-developed.  HENT:     Head: Normocephalic and atraumatic.     Right Ear: Ear canal and external ear normal. A middle ear effusion is present. Tympanic membrane is not erythematous or bulging.     Left Ear: Ear canal and external ear normal. A middle ear effusion is present. Tympanic membrane is not erythematous or bulging.     Nose: Mucosal edema and  congestion present.     Right Sinus: No maxillary sinus tenderness or frontal sinus tenderness.     Left Sinus: No maxillary sinus tenderness or frontal sinus tenderness.     Mouth/Throat:     Pharynx: Uvula midline. Posterior oropharyngeal erythema present.     Tonsils: No tonsillar exudate or tonsillar abscesses. Swelling: 0 on the right. 0 on the left.  Eyes:     Conjunctiva/sclera: Conjunctivae normal.  Neck:     Musculoskeletal: Normal range of motion.  Cardiovascular:     Rate and Rhythm: Normal rate and regular rhythm.     Heart sounds: Normal heart sounds.  Pulmonary:     Effort: Pulmonary effort is normal.     Breath sounds: Wheezing (diffuse wheezes in b/l lung fields) present. No decreased breath sounds, rhonchi or rales.  Lymphadenopathy:     Head:     Right side of head: No submental, submandibular, tonsillar, preauricular, posterior auricular or occipital adenopathy.     Left side of head: No submental, submandibular, tonsillar, preauricular, posterior auricular or occipital adenopathy.     Cervical: No cervical adenopathy.     Upper Body:     Right upper body: No supraclavicular adenopathy.     Left upper body: No supraclavicular adenopathy.  Skin:    General: Skin is warm and dry.  Neurological:     Mental Status: She is alert and oriented to person, place, and time.     No results found for this or any previous visit (from  the past 24 hour(s)).  Breathing treatment of albuterol given in office. Patient reports feeling much better. Wheezing completely resolved. Lungs CTAB.  auscultated on lung exam.. SpO2 increased from 94% pre breathing treatment to 98% post breathing treatment.   Assessment and Plan :  1. Acute bronchitis with bronchospasm Pt is overall well appearing, NAD. Vitals stable. Wheezing completely resolved with one altuerol neb in office. SpO2 increased.  Lungs CTAB. Consistent with acute bronchitis with bronchospasm. Discussed tx options with pt. She  declines prednisone Rx at this time. Rec albuterol inhaler prn every 4-6 hours. Discussed with pt that if she is having to use consistently over the next 48 hours, she should return to office or seek care at PCP, local urgent care, or ED. Furthermore, if her cough worsens or she develops fever, or new concerning sx, would suggest f/u as she may warrant abx therapy at that time. She agrees to plan.  - Albuterol Sulfate (PROAIR RESPICLICK) 553 (90 Base) MCG/ACT AEPB; Inhale 2 puffs into the lungs every 4 (four) hours.  Dispense: 1 each; Refill: 0 - Dextromethorphan-guaiFENesin (MUCINEX DM MAXIMUM STRENGTH) 60-1200 MG TB12; Take 1 tablet by mouth every 12 (twelve) hours.  Dispense: 20 each; Refill: 0  2. Wheezing Resolved.  - albuterol (PROVENTIL) (2.5 MG/3ML) 0.083% nebulizer solution 2.5 mg   Exam findings, diagnosis etiology, medication use, indications, side effects, risks, benefits, and alternatives of the medications and treatment plan prescribed today and reviewed with patient. Follow- Up and discharge instructions provided. No emergent/urgent issues found on exam. Patient education was provided. Patient verbalized understanding of information provided and agrees with plan of care (POC), all questions answered. No barriers to understanding were identified. Red flags discussed in detail. The patient is advised to call or return to clinic if condition does not see an improvement in symptoms, or to seek the care of the closest emergency department if condition worsens with the above plan.   Tenna Delaine, PA-C  [facility] 01/17/2018 8:55 AM

## 2018-01-16 NOTE — Patient Instructions (Signed)
Thank you for choosing [facility] for your health care needs.   You have been diagnosed with bronchitis (chest cold) with bronchospasm (wheezing).   Recommend increase fluids; water, Gatorade, or hot tea with water or lemon. May use humidifier or vaporizer in bedroom. Use several pillows to prop self up at night, may help with coughing and sleeping. Rest.    You have been prescribed an inhaler. Use 2 puffs every 4-6 hours (when awake) for cough/wheezing.   I also suggest mucinex-dm as prescribed.   Hope you feel better soon.   Follow-up with family physician, Leodis Binet, or urgent care in 4-5 days if symptoms not improving. Sooner with any worsening symptoms.    Acute Bronchitis, Adult Acute bronchitis is when air tubes (bronchi) in the lungs suddenly get swollen. The condition can make it hard to breathe. It can also cause these symptoms:  A cough.  Coughing up clear, yellow, or green mucus.  Wheezing.  Chest congestion.  Shortness of breath.  A fever.  Body aches.  Chills.  A sore throat. Follow these instructions at home:  Medicines  Take over-the-counter and prescription medicines only as told by your doctor.  If you were prescribed an antibiotic medicine, take it as told by your doctor. Do not stop taking the antibiotic even if you start to feel better. General instructions  Rest.  Drink enough fluids to keep your pee (urine) pale yellow.  Avoid smoking and secondhand smoke. If you smoke and you need help quitting, ask your doctor. Quitting will help your lungs heal faster.  Use an inhaler, cool mist vaporizer, or humidifier as told by your doctor.  Keep all follow-up visits as told by your doctor. This is important. How is this prevented? To lower your risk of getting this condition again:  Wash your hands often with soap and water. If you cannot use soap and water, use hand sanitizer.  Avoid contact with people who have cold symptoms.  Try not to  touch your hands to your mouth, nose, or eyes.  Make sure to get the flu shot every year. Contact a doctor if:  Your symptoms do not get better in 2 weeks. Get help right away if:  You cough up blood.  You have chest pain.  You have very bad shortness of breath.  You become dehydrated.  You faint (pass out) or keep feeling like you are going to pass out.  You keep throwing up (vomiting).  You have a very bad headache.  Your fever or chills gets worse. This information is not intended to replace advice given to you by your health care provider. Make sure you discuss any questions you have with your health care provider. Document Released: 07/05/2007 Document Revised: 08/30/2016 Document Reviewed: 07/07/2015 Elsevier Interactive Patient Education  2019 [employer].

## 2018-01-17 DIAGNOSIS — J209 Acute bronchitis, unspecified: Secondary | ICD-10-CM | POA: Diagnosis not present

## 2018-01-17 DIAGNOSIS — R51 Headache: Secondary | ICD-10-CM | POA: Diagnosis not present

## 2018-01-17 MED FILL — predniSONE 20 MG TABS: 20 | 5 days supply | Qty: 10 | Fill #0

## 2018-01-17 MED FILL — HYDROCODONE-HOMATROPINE SOL: 5-1.5 | 10 days supply | Qty: 200 | Fill #0

## 2018-01-17 MED FILL — AZITHROMYCIN 250 MG TABLET: 250 | 5 days supply | Qty: 6 | Fill #0

## 2018-02-14 ENCOUNTER — Ambulatory Visit
Admission: RE | Admit: 2018-02-14 | Discharge: 2018-02-14 | Disposition: A | Discharge status: Home / Self Care | Payer: 59 | Source: Ambulatory Visit | Attending: Obstetrics and Gynecology | Admitting: Obstetrics and Gynecology

## 2018-02-14 ENCOUNTER — Other Ambulatory Visit: Payer: Self-pay | Admitting: Obstetrics and Gynecology

## 2018-02-14 DIAGNOSIS — N6310 Unspecified lump in the right breast, unspecified quadrant: Secondary | ICD-10-CM

## 2018-02-14 DIAGNOSIS — N6011 Diffuse cystic mastopathy of right breast: Secondary | ICD-10-CM | POA: Diagnosis not present

## 2018-02-14 DIAGNOSIS — Z1231 Encounter for screening mammogram for malignant neoplasm of breast: Secondary | ICD-10-CM

## 2018-02-14 DIAGNOSIS — N63 Unspecified lump in unspecified breast: Secondary | ICD-10-CM

## 2018-03-12 DIAGNOSIS — D2271 Melanocytic nevi of right lower limb, including hip: Secondary | ICD-10-CM | POA: Diagnosis not present

## 2018-03-12 DIAGNOSIS — D2221 Melanocytic nevi of right ear and external auricular canal: Secondary | ICD-10-CM | POA: Diagnosis not present

## 2018-03-12 DIAGNOSIS — D2372 Other benign neoplasm of skin of left lower limb, including hip: Secondary | ICD-10-CM | POA: Diagnosis not present

## 2018-03-12 DIAGNOSIS — D2272 Melanocytic nevi of left lower limb, including hip: Secondary | ICD-10-CM | POA: Diagnosis not present

## 2018-03-12 DIAGNOSIS — Z86018 Personal history of other benign neoplasm: Secondary | ICD-10-CM | POA: Diagnosis not present

## 2018-03-12 DIAGNOSIS — L918 Other hypertrophic disorders of the skin: Secondary | ICD-10-CM | POA: Diagnosis not present

## 2018-03-12 DIAGNOSIS — D225 Melanocytic nevi of trunk: Secondary | ICD-10-CM | POA: Diagnosis not present

## 2018-03-12 DIAGNOSIS — L821 Other seborrheic keratosis: Secondary | ICD-10-CM | POA: Diagnosis not present

## 2018-03-13 ENCOUNTER — Ambulatory Visit
Admission: RE | Admit: 2018-03-13 | Discharge: 2018-03-13 | Disposition: A | Discharge status: Home / Self Care | Payer: 59 | Source: Ambulatory Visit | Attending: Obstetrics and Gynecology | Admitting: Obstetrics and Gynecology

## 2018-03-13 DIAGNOSIS — Z1231 Encounter for screening mammogram for malignant neoplasm of breast: Secondary | ICD-10-CM

## 2018-07-16 DIAGNOSIS — M25571 Pain in right ankle and joints of right foot: Secondary | ICD-10-CM | POA: Diagnosis not present

## 2018-07-16 DIAGNOSIS — G5762 Lesion of plantar nerve, left lower limb: Secondary | ICD-10-CM | POA: Diagnosis not present

## 2018-07-16 DIAGNOSIS — M25572 Pain in left ankle and joints of left foot: Secondary | ICD-10-CM | POA: Diagnosis not present

## 2018-07-16 DIAGNOSIS — G5761 Lesion of plantar nerve, right lower limb: Secondary | ICD-10-CM | POA: Diagnosis not present

## 2018-08-16 ENCOUNTER — Ambulatory Visit
Admission: RE | Admit: 2018-08-16 | Discharge: 2018-08-16 | Disposition: A | Discharge status: Home / Self Care | Payer: 59 | Source: Ambulatory Visit | Attending: Obstetrics and Gynecology | Admitting: Obstetrics and Gynecology

## 2018-08-16 ENCOUNTER — Other Ambulatory Visit: Payer: Self-pay

## 2018-08-16 DIAGNOSIS — N63 Unspecified lump in unspecified breast: Secondary | ICD-10-CM

## 2018-08-16 DIAGNOSIS — N6011 Diffuse cystic mastopathy of right breast: Secondary | ICD-10-CM | POA: Diagnosis not present

## 2018-09-26 DIAGNOSIS — Z1329 Encounter for screening for other suspected endocrine disorder: Secondary | ICD-10-CM | POA: Diagnosis not present

## 2018-09-26 DIAGNOSIS — Z13220 Encounter for screening for lipoid disorders: Secondary | ICD-10-CM | POA: Diagnosis not present

## 2018-09-26 DIAGNOSIS — Z6822 Body mass index (BMI) 22.0-22.9, adult: Secondary | ICD-10-CM | POA: Diagnosis not present

## 2018-09-26 DIAGNOSIS — Z01419 Encounter for gynecological examination (general) (routine) without abnormal findings: Secondary | ICD-10-CM | POA: Diagnosis not present

## 2018-09-26 DIAGNOSIS — B373 Candidiasis of vulva and vagina: Secondary | ICD-10-CM | POA: Diagnosis not present

## 2018-09-26 DIAGNOSIS — Z13228 Encounter for screening for other metabolic disorders: Secondary | ICD-10-CM | POA: Diagnosis not present

## 2018-09-26 DIAGNOSIS — Z1321 Encounter for screening for nutritional disorder: Secondary | ICD-10-CM | POA: Diagnosis not present

## 2018-09-26 MED FILL — TERCONAZOLE 0.8% VAGINAL CR: 0.8 | 3 days supply | Qty: 20 | Fill #0

## 2018-09-27 DIAGNOSIS — H5213 Myopia, bilateral: Secondary | ICD-10-CM | POA: Diagnosis not present

## 2018-10-30 DIAGNOSIS — Z0000 Encounter for general adult medical examination without abnormal findings: Secondary | ICD-10-CM | POA: Diagnosis not present

## 2018-12-19 DIAGNOSIS — M542 Cervicalgia: Secondary | ICD-10-CM | POA: Diagnosis not present

## 2018-12-19 DIAGNOSIS — M25511 Pain in right shoulder: Secondary | ICD-10-CM | POA: Diagnosis not present

## 2018-12-19 MED FILL — predniSONE 10 MG TABS: 10 | 9 days supply | Qty: 18 | Fill #0

## 2018-12-19 MED FILL — BACLOFEN 5 MG TABS: 5 | 15 days supply | Qty: 30 | Fill #0

## 2018-12-21 ENCOUNTER — Emergency Department (HOSPITAL_BASED_OUTPATIENT_CLINIC_OR_DEPARTMENT_OTHER)
Admission: EM | Admit: 2018-12-21 | Discharge: 2018-12-21 | Disposition: A | Discharge status: Home / Self Care | Payer: 59 | Attending: Emergency Medicine | Admitting: Emergency Medicine

## 2018-12-21 ENCOUNTER — Encounter (HOSPITAL_BASED_OUTPATIENT_CLINIC_OR_DEPARTMENT_OTHER): Payer: Self-pay | Admitting: Emergency Medicine

## 2018-12-21 ENCOUNTER — Other Ambulatory Visit: Payer: Self-pay

## 2018-12-21 DIAGNOSIS — M62838 Other muscle spasm: Secondary | ICD-10-CM | POA: Diagnosis present

## 2018-12-21 DIAGNOSIS — M436 Torticollis: Secondary | ICD-10-CM | POA: Insufficient documentation

## 2018-12-21 DIAGNOSIS — Z887 Allergy status to serum and vaccine status: Secondary | ICD-10-CM | POA: Diagnosis not present

## 2018-12-21 LAB — CBC WITH DIFFERENTIAL/PLATELET
Abs Immature Granulocytes: 0.06 10*3/uL (ref 0.00–0.07)
Basophils Absolute: 0 10*3/uL (ref 0.0–0.1)
Basophils Relative: 0 %
Eosinophils Absolute: 0 10*3/uL (ref 0.0–0.5)
Eosinophils Relative: 0 %
HCT: 40.9 % (ref 36.0–46.0)
Hemoglobin: 13.6 g/dL (ref 12.0–15.0)
Immature Granulocytes: 1 %
Lymphocytes Relative: 8 %
Lymphs Abs: 1 10*3/uL (ref 0.7–4.0)
MCH: 31.4 pg (ref 26.0–34.0)
MCHC: 33.3 g/dL (ref 30.0–36.0)
MCV: 94.5 fL (ref 80.0–100.0)
Monocytes Absolute: 0.4 10*3/uL (ref 0.1–1.0)
Monocytes Relative: 3 %
Neutro Abs: 11.2 10*3/uL — ABNORMAL HIGH (ref 1.7–7.7)
Neutrophils Relative %: 88 %
Platelets: 322 10*3/uL (ref 150–400)
RBC: 4.33 MIL/uL (ref 3.87–5.11)
RDW: 11.7 % (ref 11.5–15.5)
WBC: 12.7 10*3/uL — ABNORMAL HIGH (ref 4.0–10.5)
nRBC: 0 % (ref 0.0–0.2)

## 2018-12-21 LAB — COMPREHENSIVE METABOLIC PANEL
ALT: 16 U/L (ref 0–44)
AST: 19 U/L (ref 15–41)
Albumin: 4.4 g/dL (ref 3.5–5.0)
Alkaline Phosphatase: 42 U/L (ref 38–126)
Anion gap: 9 (ref 5–15)
BUN: 11 mg/dL (ref 6–20)
CO2: 21 mmol/L — ABNORMAL LOW (ref 22–32)
Calcium: 9.1 mg/dL (ref 8.9–10.3)
Chloride: 106 mmol/L (ref 98–111)
Creatinine, Ser: 0.68 mg/dL (ref 0.44–1.00)
GFR calc Af Amer: >60 mL/min (ref >60)
GFR calc non Af Amer: >60 mL/min (ref >60)
Glucose, Bld: 139 mg/dL — ABNORMAL HIGH (ref 70–99)
Potassium: 3.9 mmol/L (ref 3.5–5.1)
Sodium: 136 mmol/L (ref 135–145)
Total Bilirubin: 0.8 mg/dL (ref 0.3–1.2)
Total Protein: 7.5 g/dL (ref 6.5–8.1)

## 2018-12-21 LAB — CK: Total CK: 42 U/L (ref 38–234)

## 2018-12-21 MED ORDER — KETOROLAC TROMETHAMINE 30 MG/ML IJ SOLN
30.0000 mg | Freq: Once | INTRAMUSCULAR | Status: AC
  Administered 2018-12-21: 30 mg via INTRAVENOUS
  Filled 2018-12-21: qty 1

## 2018-12-21 MED ORDER — HYDROCODONE-ACETAMINOPHEN 5-325 MG PO TABS: 1.0000 | ORAL_TABLET | Freq: Four times a day (QID) | ORAL | 0 refills | Status: AC | PRN

## 2018-12-21 MED ORDER — MORPHINE SULFATE (PF) 4 MG/ML IV SOLN
4.0000 mg | Freq: Once | INTRAVENOUS | Status: AC
  Administered 2018-12-21: 4 mg via INTRAVENOUS
  Filled 2018-12-21: qty 1

## 2018-12-21 MED ORDER — DIAZEPAM 5 MG/ML IJ SOLN
5.0000 mg | Freq: Once | INTRAMUSCULAR | Status: AC
  Administered 2018-12-21: 5 mg via INTRAVENOUS
  Filled 2018-12-21: qty 2

## 2018-12-21 NOTE — ED Provider Notes (Signed)
[facility] EM[facility] DEPARTMENT Provider Note   CSN: [id number] Arrival date & time: 12/21/18  1429     History   Chief Complaint Chief Complaint  Patient presents with  . Spasms    HPI Sheena Williams is a 48 y.o. female she migraines here presenting with right neck spasms. Patient started having right-sided neck spasms for the last 4 to 5 days.  Patient went to see Dr. Debroah Loop partner 3 days ago and received a shot of Toradol and Solu-Medrol .  She also was prescribed some Valium. She states that since then she has worsening spasm on the right side.  She apparently had x-rays in the office that was unremarkable.  She denies any numbness or weakness of her hand or arm.  She is requesting pain medicine for her pain control.     The history is provided by the patient.    Past Medical History:  Diagnosis Date  . Migraines   . PVC's (premature ventricular contractions)     Patient Active Problem List   Diagnosis Date Noted  . PVC's (premature ventricular contractions) 01/13/2013  . 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 TONSILLECTOMY  1993     OB History   No obstetric history on file.      Home Medications    Prior to Admission medications   Medication Sig Start Date End Date Taking? Authorizing Provider  Albuterol Sulfate (PROAIR RESPICLICK) 123XX123 (90 Base) MCG/ACT AEPB Inhale 2 puffs into the lungs every 4 (four) hours. 01/16/18   Tenna Delaine D, PA-C  Dextromethorphan-guaiFENesin (MUCINEX DM MAXIMUM STRENGTH) 60-1200 MG TB12 Take 1 tablet by mouth every 12 (twelve) hours. 01/16/18   Leonie Douglas, PA-C    Family History Family History  Problem Relation Age of Onset  . Heart disease Father   . Cancer Maternal Aunt        Breast  . Cancer Maternal Grandmother        Breast  . Cancer Paternal Grandmother        lung    Social History Social History   Tobacco Use  .  Smoking status: Never Smoker  . Smokeless tobacco: Never Used  Substance Use Topics  . Alcohol use: Yes    Comment: rarely  . Drug use: No     Allergies   Tetanus toxoids   Review of Systems Review of Systems  Musculoskeletal: Positive for neck pain.  All other systems reviewed and are negative.    Physical Exam Updated Vital Signs BP 135/79   Pulse 84   Temp 97.8 F (36.6 C) (Oral)   Resp 18   SpO2 100%   Physical Exam Vitals signs and nursing note reviewed.  Constitutional:      Comments: Uncomfortable   HENT:     Head: Normocephalic.     Mouth/Throat:     Mouth: Mucous membranes are moist.  Eyes:     Extraocular Movements: Extraocular movements intact.     Pupils: Pupils are equal, round, and reactive to light.  Neck:     Comments: R paracervical tenderness, dec ROM from pain. No meningeal signs  Cardiovascular:     Rate and Rhythm: Normal rate and regular rhythm.     Pulses: Normal pulses.     Heart sounds: Normal heart sounds.  Pulmonary:     Effort: Pulmonary effort is normal.     Breath sounds: Normal breath sounds.  Abdominal:  General: Abdomen is flat.     Palpations: Abdomen is soft.  Musculoskeletal:     Comments: Nl ROM R shoulder and elbow. 2+ radial pulses. Nl hand grasp. + bicep muscle spasms as well . Otherwise neurovascular intact RUE   Skin:    General: Skin is warm.     Capillary Refill: Capillary refill takes less than 2 seconds.  Neurological:     General: No focal deficit present.     Mental Status: She is alert.  Psychiatric:        Mood and Affect: Mood normal.      ED Treatments / Results  Labs (all labs ordered are listed, but only abnormal results are displayed) Labs Reviewed  CBC WITH DIFFERENTIAL/PLATELET - Abnormal; Notable for the following components:      Result Value   WBC 12.7 (*)    Neutro Abs 11.2 (*)    All other components within normal limits  COMPREHENSIVE METABOLIC PANEL - Abnormal; Notable for the  following components:   CO2 21 (*)    Glucose, Bld 139 (*)    All other components within normal limits  CK    EKG None  Radiology No results found.  Procedures Procedures (including critical care time)  Medications Ordered in ED Medications  morphine 4 MG/ML injection 4 mg (4 mg Intravenous Given 12/21/18 1549)  ketorolac (TORADOL) 30 MG/ML injection 30 mg (30 mg Intravenous Given 12/21/18 1549)  diazepam (VALIUM) injection 5 mg (5 mg Intravenous Given 12/21/18 1549)     Initial Impression / Assessment and Plan / ED Course  I have reviewed the triage vital signs and the nursing notes.  Pertinent labs & imaging results that were available during my care of the patient were reviewed by me and considered in my medical decision making (see chart for details).        Sheena Williams is a 48 y.o. female with torticollis.  Patient likely has worsening neck spasms. Patient already had a negative x-ray several days ago at the orthopedic doctor office Patient is already on prednisone and Valium as well .  I think likely muscle spasms. We will check a CK level and give IV toradol and valium and pain meds.   4:44 PM CK normal. Felt better. Will dc home with vicodin for pain. She has ortho follow up   Final Clinical Impressions(s) / ED Diagnoses   Final diagnoses:  None    ED Discharge Orders    None       Drenda Freeze, MD 12/21/18 1645

## 2018-12-21 NOTE — Discharge Instructions (Signed)
Continue prednisone and valium as prescribed   Take vicodin for severe pain   See Dr. Debroah Loop office for follow up   Return to ER if you have worse neck pain, unable to move your arm, weakness, numbness

## 2018-12-21 NOTE — ED Triage Notes (Signed)
Pt here with muscle spasms in neck for 1 week.

## 2018-12-24 DIAGNOSIS — M5000 Cervical disc disorder with myelopathy, unspecified cervical region: Secondary | ICD-10-CM | POA: Diagnosis not present

## 2018-12-24 DIAGNOSIS — M542 Cervicalgia: Secondary | ICD-10-CM | POA: Diagnosis not present

## 2018-12-24 DIAGNOSIS — M25511 Pain in right shoulder: Secondary | ICD-10-CM | POA: Diagnosis not present

## 2018-12-25 ENCOUNTER — Other Ambulatory Visit: Payer: Self-pay | Admitting: Sports Medicine

## 2018-12-25 DIAGNOSIS — M5412 Radiculopathy, cervical region: Secondary | ICD-10-CM

## 2018-12-31 ENCOUNTER — Other Ambulatory Visit: Payer: Self-pay

## 2018-12-31 ENCOUNTER — Ambulatory Visit
Admission: RE | Admit: 2018-12-31 | Discharge: 2018-12-31 | Disposition: A | Discharge status: Home / Self Care | Payer: 59 | Source: Ambulatory Visit | Attending: Sports Medicine | Admitting: Sports Medicine

## 2018-12-31 DIAGNOSIS — M5412 Radiculopathy, cervical region: Secondary | ICD-10-CM

## 2018-12-31 DIAGNOSIS — M79601 Pain in right arm: Secondary | ICD-10-CM | POA: Diagnosis not present

## 2018-12-31 DIAGNOSIS — M5023 Other cervical disc displacement, cervicothoracic region: Secondary | ICD-10-CM | POA: Diagnosis not present

## 2018-12-31 MED ORDER — IOPAMIDOL (ISOVUE-M 300) INJECTION 61%
1.0000 mL | Freq: Once | INTRAMUSCULAR | Status: AC
  Administered 2018-12-31: 1 mL via INTRATHECAL

## 2018-12-31 MED ORDER — TRIAMCINOLONE ACETONIDE 40 MG/ML IJ SUSP (RADIOLOGY)
60.0000 mg | Freq: Once | INTRAMUSCULAR | Status: AC
  Administered 2018-12-31: 60 mg via EPIDURAL

## 2018-12-31 NOTE — Discharge Instructions (Signed)

## 2019-01-03 MED FILL — METHYLPREDNISOLONE 4 MG TAB: 4 | 6 days supply | Qty: 21 | Fill #0

## 2019-01-07 MED FILL — diazePAM 5 MG TABS: 5 | 15 days supply | Qty: 30 | Fill #0

## 2019-01-07 MED FILL — HYDROCODON-APAP 5-325: 5-325 | 10 days supply | Qty: 40 | Fill #0

## 2019-01-08 DIAGNOSIS — Z1159 Encounter for screening for other viral diseases: Secondary | ICD-10-CM | POA: Diagnosis not present

## 2019-01-15 DIAGNOSIS — M5412 Radiculopathy, cervical region: Secondary | ICD-10-CM | POA: Diagnosis not present

## 2019-01-15 DIAGNOSIS — M50122 Cervical disc disorder at C5-C6 level with radiculopathy: Secondary | ICD-10-CM | POA: Diagnosis not present

## 2019-01-15 MED FILL — HYDROCODON-APAP 5-325: 5-325 | 10 days supply | Qty: 40 | Fill #0

## 2019-01-15 MED FILL — METHOCARBAMOL 500 MG TABS: 500 | 6 days supply | Qty: 45 | Fill #0

## 2019-02-04 DIAGNOSIS — M5412 Radiculopathy, cervical region: Secondary | ICD-10-CM | POA: Diagnosis not present

## 2019-02-04 MED FILL — METHOCARBAMOL 500 MG TABS: 500 | 5 days supply | Qty: 45 | Fill #0

## 2019-02-11 ENCOUNTER — Other Ambulatory Visit: Payer: Self-pay | Admitting: Obstetrics and Gynecology

## 2019-02-11 DIAGNOSIS — N6310 Unspecified lump in the right breast, unspecified quadrant: Secondary | ICD-10-CM

## 2019-03-27 DIAGNOSIS — D2221 Melanocytic nevi of right ear and external auricular canal: Secondary | ICD-10-CM | POA: Diagnosis not present

## 2019-03-27 DIAGNOSIS — B078 Other viral warts: Secondary | ICD-10-CM | POA: Diagnosis not present

## 2019-03-27 DIAGNOSIS — D2272 Melanocytic nevi of left lower limb, including hip: Secondary | ICD-10-CM | POA: Diagnosis not present

## 2019-03-27 DIAGNOSIS — L821 Other seborrheic keratosis: Secondary | ICD-10-CM | POA: Diagnosis not present

## 2019-03-27 DIAGNOSIS — D225 Melanocytic nevi of trunk: Secondary | ICD-10-CM | POA: Diagnosis not present

## 2019-03-27 DIAGNOSIS — Z23 Encounter for immunization: Secondary | ICD-10-CM | POA: Diagnosis not present

## 2019-03-27 DIAGNOSIS — D2372 Other benign neoplasm of skin of left lower limb, including hip: Secondary | ICD-10-CM | POA: Diagnosis not present

## 2019-03-27 DIAGNOSIS — Z86018 Personal history of other benign neoplasm: Secondary | ICD-10-CM | POA: Diagnosis not present

## 2019-03-27 DIAGNOSIS — D2271 Melanocytic nevi of right lower limb, including hip: Secondary | ICD-10-CM | POA: Diagnosis not present

## 2019-03-28 ENCOUNTER — Ambulatory Visit
Admission: RE | Admit: 2019-03-28 | Discharge: 2019-03-28 | Disposition: A | Discharge status: Home / Self Care | Payer: 59 | Source: Ambulatory Visit | Attending: Obstetrics and Gynecology | Admitting: Obstetrics and Gynecology

## 2019-03-28 ENCOUNTER — Other Ambulatory Visit: Payer: Self-pay

## 2019-03-28 DIAGNOSIS — N6310 Unspecified lump in the right breast, unspecified quadrant: Secondary | ICD-10-CM

## 2019-03-28 DIAGNOSIS — N6001 Solitary cyst of right breast: Secondary | ICD-10-CM | POA: Diagnosis not present

## 2019-03-28 DIAGNOSIS — R922 Inconclusive mammogram: Secondary | ICD-10-CM | POA: Diagnosis not present

## 2019-10-10 DIAGNOSIS — H5213 Myopia, bilateral: Secondary | ICD-10-CM | POA: Diagnosis not present

## 2019-10-30 DIAGNOSIS — Z6821 Body mass index (BMI) 21.0-21.9, adult: Secondary | ICD-10-CM | POA: Diagnosis not present

## 2019-10-30 DIAGNOSIS — Z01419 Encounter for gynecological examination (general) (routine) without abnormal findings: Secondary | ICD-10-CM | POA: Diagnosis not present

## 2019-11-04 DIAGNOSIS — Z13220 Encounter for screening for lipoid disorders: Secondary | ICD-10-CM | POA: Diagnosis not present

## 2019-11-04 DIAGNOSIS — Z0000 Encounter for general adult medical examination without abnormal findings: Secondary | ICD-10-CM | POA: Diagnosis not present

## 2020-01-22 ENCOUNTER — Other Ambulatory Visit: Payer: 59

## 2020-01-22 DIAGNOSIS — Z20822 Contact with and (suspected) exposure to covid-19: Secondary | ICD-10-CM | POA: Diagnosis not present

## 2020-01-24 LAB — NOVEL CORONAVIRUS, NAA

## 2020-02-12 ENCOUNTER — Other Ambulatory Visit: Payer: Self-pay | Admitting: Obstetrics and Gynecology

## 2020-02-12 DIAGNOSIS — N6320 Unspecified lump in the left breast, unspecified quadrant: Secondary | ICD-10-CM

## 2020-03-30 DIAGNOSIS — Z86018 Personal history of other benign neoplasm: Secondary | ICD-10-CM | POA: Diagnosis not present

## 2020-03-30 DIAGNOSIS — L578 Other skin changes due to chronic exposure to nonionizing radiation: Secondary | ICD-10-CM | POA: Diagnosis not present

## 2020-03-30 DIAGNOSIS — D2372 Other benign neoplasm of skin of left lower limb, including hip: Secondary | ICD-10-CM | POA: Diagnosis not present

## 2020-03-30 DIAGNOSIS — D2271 Melanocytic nevi of right lower limb, including hip: Secondary | ICD-10-CM | POA: Diagnosis not present

## 2020-03-30 DIAGNOSIS — D225 Melanocytic nevi of trunk: Secondary | ICD-10-CM | POA: Diagnosis not present

## 2020-03-30 DIAGNOSIS — D2221 Melanocytic nevi of right ear and external auricular canal: Secondary | ICD-10-CM | POA: Diagnosis not present

## 2020-03-30 DIAGNOSIS — B078 Other viral warts: Secondary | ICD-10-CM | POA: Diagnosis not present

## 2020-03-30 DIAGNOSIS — L820 Inflamed seborrheic keratosis: Secondary | ICD-10-CM | POA: Diagnosis not present

## 2020-03-30 DIAGNOSIS — L821 Other seborrheic keratosis: Secondary | ICD-10-CM | POA: Diagnosis not present

## 2020-03-30 DIAGNOSIS — L570 Actinic keratosis: Secondary | ICD-10-CM | POA: Diagnosis not present

## 2020-03-30 DIAGNOSIS — D2272 Melanocytic nevi of left lower limb, including hip: Secondary | ICD-10-CM | POA: Diagnosis not present

## 2020-03-31 ENCOUNTER — Other Ambulatory Visit: Payer: 59

## 2020-04-05 ENCOUNTER — Other Ambulatory Visit (HOSPITAL_COMMUNITY): Payer: Self-pay | Admitting: Pharmacist

## 2020-04-05 MED FILL — CARESTART COVID-19 HOME TES: 4 days supply | Qty: 4 | Fill #0

## 2020-04-07 ENCOUNTER — Other Ambulatory Visit: Payer: Self-pay

## 2020-04-07 ENCOUNTER — Ambulatory Visit
Admission: RE | Admit: 2020-04-07 | Discharge: 2020-04-07 | Disposition: A | Discharge status: Home / Self Care | Payer: 59 | Source: Ambulatory Visit | Attending: Obstetrics and Gynecology | Admitting: Obstetrics and Gynecology

## 2020-04-07 DIAGNOSIS — R922 Inconclusive mammogram: Secondary | ICD-10-CM | POA: Diagnosis not present

## 2020-04-07 DIAGNOSIS — N6011 Diffuse cystic mastopathy of right breast: Secondary | ICD-10-CM | POA: Diagnosis not present

## 2020-04-07 DIAGNOSIS — N6320 Unspecified lump in the left breast, unspecified quadrant: Secondary | ICD-10-CM

## 2020-04-09 ENCOUNTER — Other Ambulatory Visit: Payer: Self-pay | Admitting: Obstetrics and Gynecology

## 2020-04-09 DIAGNOSIS — Z9189 Other specified personal risk factors, not elsewhere classified: Secondary | ICD-10-CM

## 2020-05-17 ENCOUNTER — Other Ambulatory Visit (HOSPITAL_COMMUNITY): Payer: Self-pay | Admitting: Obstetrics and Gynecology

## 2020-05-17 DIAGNOSIS — Z9189 Other specified personal risk factors, not elsewhere classified: Secondary | ICD-10-CM

## 2020-06-07 ENCOUNTER — Other Ambulatory Visit (HOSPITAL_COMMUNITY): Payer: Self-pay

## 2020-06-07 DIAGNOSIS — R0989 Other specified symptoms and signs involving the circulatory and respiratory systems: Secondary | ICD-10-CM | POA: Diagnosis not present

## 2020-06-07 DIAGNOSIS — K219 Gastro-esophageal reflux disease without esophagitis: Secondary | ICD-10-CM | POA: Diagnosis not present

## 2020-06-07 MED ORDER — OMEPRAZOLE 40 MG PO CPDR
40.0000 mg | DELAYED_RELEASE_CAPSULE | ORAL | 11 refills | Status: AC
  Filled 2020-06-07: qty 30, 30d supply, fill #0
  Filled 2020-07-06: qty 30, 30d supply, fill #1
  Filled 2020-08-18: qty 30, 30d supply, fill #2
  Filled 2020-09-27: qty 30, 30d supply, fill #3
  Filled 2020-10-25: qty 30, 30d supply, fill #4
  Filled 2020-11-21 – 2020-11-30 (×2): qty 30, 30d supply, fill #5
  Filled 2021-01-06: qty 90, 90d supply, fill #6
  Filled 2021-04-04: qty 90, 90d supply, fill #7

## 2020-07-06 ENCOUNTER — Other Ambulatory Visit (HOSPITAL_COMMUNITY): Payer: Self-pay

## 2020-08-10 ENCOUNTER — Other Ambulatory Visit (HOSPITAL_COMMUNITY): Payer: Self-pay

## 2020-08-10 MED ORDER — CARESTART COVID-19 HOME TEST VI KIT
PACK | 0 refills | Status: DC
  Filled 2020-08-10: qty 4, 4d supply, fill #0

## 2020-08-19 ENCOUNTER — Other Ambulatory Visit (HOSPITAL_COMMUNITY): Payer: Self-pay

## 2020-09-01 DIAGNOSIS — U071 COVID-19: Secondary | ICD-10-CM | POA: Diagnosis not present

## 2020-09-07 ENCOUNTER — Other Ambulatory Visit (HOSPITAL_COMMUNITY): Payer: Self-pay

## 2020-09-07 MED ORDER — AMOXICILLIN 875 MG PO TABS
875.0000 mg | ORAL_TABLET | Freq: Two times a day (BID) | ORAL | 0 refills | Status: AC
  Filled 2020-09-07: qty 14, 7d supply, fill #0

## 2020-09-22 ENCOUNTER — Other Ambulatory Visit: Payer: Self-pay

## 2020-09-22 ENCOUNTER — Ambulatory Visit (HOSPITAL_COMMUNITY)
Admission: RE | Admit: 2020-09-22 | Discharge: 2020-09-22 | Disposition: A | Discharge status: Home / Self Care | Payer: 59 | Source: Ambulatory Visit | Attending: Obstetrics and Gynecology | Admitting: Obstetrics and Gynecology

## 2020-09-22 DIAGNOSIS — Z803 Family history of malignant neoplasm of breast: Secondary | ICD-10-CM | POA: Insufficient documentation

## 2020-09-22 DIAGNOSIS — Z9189 Other specified personal risk factors, not elsewhere classified: Secondary | ICD-10-CM | POA: Diagnosis not present

## 2020-09-22 DIAGNOSIS — D242 Benign neoplasm of left breast: Secondary | ICD-10-CM | POA: Diagnosis not present

## 2020-09-22 DIAGNOSIS — N6011 Diffuse cystic mastopathy of right breast: Secondary | ICD-10-CM | POA: Diagnosis not present

## 2020-09-22 DIAGNOSIS — N6311 Unspecified lump in the right breast, upper outer quadrant: Secondary | ICD-10-CM | POA: Insufficient documentation

## 2020-09-22 DIAGNOSIS — N6320 Unspecified lump in the left breast, unspecified quadrant: Secondary | ICD-10-CM | POA: Diagnosis present

## 2020-09-22 MED ORDER — GADOBUTROL 1 MMOL/ML IV SOLN
10.0000 mL | Freq: Once | INTRAVENOUS | Status: AC | PRN
  Administered 2020-09-22: 10 mL via INTRAVENOUS

## 2020-09-24 ENCOUNTER — Other Ambulatory Visit: Payer: Self-pay | Admitting: Obstetrics and Gynecology

## 2020-09-24 DIAGNOSIS — R9389 Abnormal findings on diagnostic imaging of other specified body structures: Secondary | ICD-10-CM

## 2020-09-27 ENCOUNTER — Other Ambulatory Visit (HOSPITAL_COMMUNITY): Payer: Self-pay

## 2020-09-27 MED ORDER — ALPRAZOLAM 0.25 MG PO TABS
0.2500 mg | ORAL_TABLET | Freq: Every day | ORAL | 5 refills | Status: AC
  Filled 2020-09-27: qty 2, 2d supply, fill #0

## 2020-09-28 ENCOUNTER — Other Ambulatory Visit (HOSPITAL_COMMUNITY): Payer: Self-pay

## 2020-09-28 MED ORDER — QUICKVUE AT-HOME COVID-19 TEST VI KIT
PACK | 0 refills | Status: DC
  Filled 2020-09-28 – 2020-10-06 (×2): qty 2, 2d supply, fill #0

## 2020-09-30 HISTORY — PX: BREAST BIOPSY: SHX20

## 2020-10-06 ENCOUNTER — Other Ambulatory Visit (HOSPITAL_COMMUNITY): Payer: Self-pay

## 2020-10-13 ENCOUNTER — Other Ambulatory Visit (HOSPITAL_COMMUNITY): Payer: Self-pay | Admitting: Diagnostic Radiology

## 2020-10-13 ENCOUNTER — Ambulatory Visit
Admission: RE | Admit: 2020-10-13 | Discharge: 2020-10-13 | Disposition: A | Discharge status: Home / Self Care | Payer: 59 | Source: Ambulatory Visit | Attending: Obstetrics and Gynecology | Admitting: Obstetrics and Gynecology

## 2020-10-13 ENCOUNTER — Other Ambulatory Visit: Payer: Self-pay

## 2020-10-13 DIAGNOSIS — R9389 Abnormal findings on diagnostic imaging of other specified body structures: Secondary | ICD-10-CM

## 2020-10-13 DIAGNOSIS — N6011 Diffuse cystic mastopathy of right breast: Secondary | ICD-10-CM | POA: Diagnosis not present

## 2020-10-13 DIAGNOSIS — N6311 Unspecified lump in the right breast, upper outer quadrant: Secondary | ICD-10-CM | POA: Diagnosis not present

## 2020-10-13 MED ORDER — GADOBUTROL 1 MMOL/ML IV SOLN
6.0000 mL | Freq: Once | INTRAVENOUS | Status: AC | PRN
  Administered 2020-10-13: 6 mL via INTRAVENOUS

## 2020-10-20 DIAGNOSIS — H5213 Myopia, bilateral: Secondary | ICD-10-CM | POA: Diagnosis not present

## 2020-10-26 ENCOUNTER — Other Ambulatory Visit (HOSPITAL_COMMUNITY): Payer: Self-pay

## 2020-11-08 DIAGNOSIS — Z6821 Body mass index (BMI) 21.0-21.9, adult: Secondary | ICD-10-CM | POA: Diagnosis not present

## 2020-11-08 DIAGNOSIS — Z01419 Encounter for gynecological examination (general) (routine) without abnormal findings: Secondary | ICD-10-CM | POA: Diagnosis not present

## 2020-11-09 ENCOUNTER — Other Ambulatory Visit: Payer: Self-pay | Admitting: Obstetrics and Gynecology

## 2020-11-09 DIAGNOSIS — Z8249 Family history of ischemic heart disease and other diseases of the circulatory system: Secondary | ICD-10-CM

## 2020-11-11 ENCOUNTER — Other Ambulatory Visit (HOSPITAL_COMMUNITY): Payer: Self-pay

## 2020-11-11 MED ORDER — CARESTART COVID-19 HOME TEST VI KIT
PACK | 0 refills | Status: AC
  Filled 2020-11-11: qty 4, 4d supply, fill #0

## 2020-11-12 ENCOUNTER — Ambulatory Visit: Payer: 59 | Attending: Internal Medicine

## 2020-11-12 ENCOUNTER — Other Ambulatory Visit: Payer: Self-pay

## 2020-11-12 ENCOUNTER — Other Ambulatory Visit (HOSPITAL_BASED_OUTPATIENT_CLINIC_OR_DEPARTMENT_OTHER): Payer: Self-pay

## 2020-11-12 DIAGNOSIS — Z1329 Encounter for screening for other suspected endocrine disorder: Secondary | ICD-10-CM | POA: Diagnosis not present

## 2020-11-12 DIAGNOSIS — Z13228 Encounter for screening for other metabolic disorders: Secondary | ICD-10-CM | POA: Diagnosis not present

## 2020-11-12 DIAGNOSIS — Z23 Encounter for immunization: Secondary | ICD-10-CM

## 2020-11-12 DIAGNOSIS — Z801 Family history of malignant neoplasm of trachea, bronchus and lung: Secondary | ICD-10-CM | POA: Diagnosis not present

## 2020-11-12 DIAGNOSIS — Z13220 Encounter for screening for lipoid disorders: Secondary | ICD-10-CM | POA: Diagnosis not present

## 2020-11-12 DIAGNOSIS — Z8249 Family history of ischemic heart disease and other diseases of the circulatory system: Secondary | ICD-10-CM | POA: Diagnosis not present

## 2020-11-12 DIAGNOSIS — Z803 Family history of malignant neoplasm of breast: Secondary | ICD-10-CM | POA: Diagnosis not present

## 2020-11-12 DIAGNOSIS — Z1321 Encounter for screening for nutritional disorder: Secondary | ICD-10-CM | POA: Diagnosis not present

## 2020-11-12 MED ORDER — PFIZER COVID-19 VAC BIVALENT 30 MCG/0.3ML IM SUSP
INTRAMUSCULAR | 0 refills | Status: AC
  Filled 2020-11-12: qty 0.3, 1d supply, fill #0

## 2020-11-12 NOTE — Progress Notes (Signed)
   Covid-19 Vaccination Clinic  Name:  Sheena Williams    MRN: [id number] DOB: 10/02/1970  11/12/2020  Sheena Williams was observed post Covid-19 immunization for 15 minutes without incident. She was provided with Vaccine Information Sheet and instruction to access the V-Safe system.   Sheena Williams was instructed to call 911 with any severe reactions post vaccine: Difficulty breathing  Swelling of face and throat  A fast heartbeat  A bad rash all over body  Dizziness and weakness

## 2020-11-22 ENCOUNTER — Other Ambulatory Visit (HOSPITAL_COMMUNITY): Payer: Self-pay

## 2020-11-30 ENCOUNTER — Other Ambulatory Visit (HOSPITAL_COMMUNITY): Payer: Self-pay

## 2020-12-07 ENCOUNTER — Other Ambulatory Visit: Payer: 59

## 2020-12-10 ENCOUNTER — Other Ambulatory Visit: Payer: 59

## 2020-12-13 DIAGNOSIS — J069 Acute upper respiratory infection, unspecified: Secondary | ICD-10-CM | POA: Diagnosis not present

## 2020-12-15 ENCOUNTER — Other Ambulatory Visit: Payer: 59

## 2020-12-15 DIAGNOSIS — J069 Acute upper respiratory infection, unspecified: Secondary | ICD-10-CM | POA: Diagnosis not present

## 2021-01-03 ENCOUNTER — Ambulatory Visit
Admission: RE | Admit: 2021-01-03 | Discharge: 2021-01-03 | Disposition: A | Discharge status: Home / Self Care | Payer: No Typology Code available for payment source | Source: Ambulatory Visit | Attending: Obstetrics and Gynecology | Admitting: Obstetrics and Gynecology

## 2021-01-03 DIAGNOSIS — Z8249 Family history of ischemic heart disease and other diseases of the circulatory system: Secondary | ICD-10-CM

## 2021-01-06 ENCOUNTER — Other Ambulatory Visit (HOSPITAL_COMMUNITY): Payer: Self-pay

## 2021-01-10 DIAGNOSIS — Z9189 Other specified personal risk factors, not elsewhere classified: Secondary | ICD-10-CM | POA: Diagnosis not present

## 2021-01-10 DIAGNOSIS — Z803 Family history of malignant neoplasm of breast: Secondary | ICD-10-CM | POA: Diagnosis not present

## 2021-02-04 DIAGNOSIS — K219 Gastro-esophageal reflux disease without esophagitis: Secondary | ICD-10-CM | POA: Diagnosis not present

## 2021-02-04 DIAGNOSIS — Z1211 Encounter for screening for malignant neoplasm of colon: Secondary | ICD-10-CM | POA: Diagnosis not present

## 2021-02-04 DIAGNOSIS — R152 Fecal urgency: Secondary | ICD-10-CM | POA: Diagnosis not present

## 2021-02-04 DIAGNOSIS — Z8719 Personal history of other diseases of the digestive system: Secondary | ICD-10-CM | POA: Diagnosis not present

## 2021-02-22 ENCOUNTER — Other Ambulatory Visit: Payer: Self-pay | Admitting: Obstetrics and Gynecology

## 2021-02-22 DIAGNOSIS — Z1231 Encounter for screening mammogram for malignant neoplasm of breast: Secondary | ICD-10-CM

## 2021-03-01 ENCOUNTER — Other Ambulatory Visit (HOSPITAL_COMMUNITY): Payer: Self-pay

## 2021-03-01 MED ORDER — CARESTART COVID-19 HOME TEST VI KIT
PACK | 0 refills | Status: AC
  Filled 2021-03-01: qty 4, 4d supply, fill #0

## 2021-03-09 ENCOUNTER — Other Ambulatory Visit (HOSPITAL_COMMUNITY): Payer: Self-pay

## 2021-03-09 MED ORDER — PEG 3350-KCL-NA BICARB-NACL 420 G PO SOLR
420.0000 g | ORAL | 0 refills | Status: AC
  Filled 2021-03-09: qty 4000, 1d supply, fill #0

## 2021-03-14 DIAGNOSIS — R197 Diarrhea, unspecified: Secondary | ICD-10-CM | POA: Diagnosis not present

## 2021-03-14 DIAGNOSIS — K648 Other hemorrhoids: Secondary | ICD-10-CM | POA: Diagnosis not present

## 2021-03-14 DIAGNOSIS — D123 Benign neoplasm of transverse colon: Secondary | ICD-10-CM | POA: Diagnosis not present

## 2021-03-14 DIAGNOSIS — Z1211 Encounter for screening for malignant neoplasm of colon: Secondary | ICD-10-CM | POA: Diagnosis not present

## 2021-03-14 DIAGNOSIS — D120 Benign neoplasm of cecum: Secondary | ICD-10-CM | POA: Diagnosis not present

## 2021-04-04 ENCOUNTER — Other Ambulatory Visit (HOSPITAL_COMMUNITY): Payer: Self-pay

## 2021-04-08 ENCOUNTER — Ambulatory Visit
Admission: RE | Admit: 2021-04-08 | Discharge: 2021-04-08 | Disposition: A | Discharge status: Home / Self Care | Payer: 59 | Source: Ambulatory Visit | Attending: Obstetrics and Gynecology | Admitting: Obstetrics and Gynecology

## 2021-04-08 DIAGNOSIS — Z1231 Encounter for screening mammogram for malignant neoplasm of breast: Secondary | ICD-10-CM

## 2021-04-11 ENCOUNTER — Other Ambulatory Visit: Payer: Self-pay | Admitting: Obstetrics and Gynecology

## 2021-04-11 DIAGNOSIS — Z803 Family history of malignant neoplasm of breast: Secondary | ICD-10-CM

## 2021-04-12 DIAGNOSIS — D2272 Melanocytic nevi of left lower limb, including hip: Secondary | ICD-10-CM | POA: Diagnosis not present

## 2021-04-12 DIAGNOSIS — M79672 Pain in left foot: Secondary | ICD-10-CM | POA: Diagnosis not present

## 2021-04-12 DIAGNOSIS — D1800 Hemangioma unspecified site: Secondary | ICD-10-CM | POA: Diagnosis not present

## 2021-04-12 DIAGNOSIS — L820 Inflamed seborrheic keratosis: Secondary | ICD-10-CM | POA: Diagnosis not present

## 2021-04-12 DIAGNOSIS — I781 Nevus, non-neoplastic: Secondary | ICD-10-CM | POA: Diagnosis not present

## 2021-04-12 DIAGNOSIS — D2271 Melanocytic nevi of right lower limb, including hip: Secondary | ICD-10-CM | POA: Diagnosis not present

## 2021-04-12 DIAGNOSIS — D225 Melanocytic nevi of trunk: Secondary | ICD-10-CM | POA: Diagnosis not present

## 2021-04-12 DIAGNOSIS — Z86018 Personal history of other benign neoplasm: Secondary | ICD-10-CM | POA: Diagnosis not present

## 2021-04-12 DIAGNOSIS — D2221 Melanocytic nevi of right ear and external auricular canal: Secondary | ICD-10-CM | POA: Diagnosis not present

## 2021-04-12 DIAGNOSIS — L578 Other skin changes due to chronic exposure to nonionizing radiation: Secondary | ICD-10-CM | POA: Diagnosis not present

## 2021-04-22 ENCOUNTER — Ambulatory Visit
Admission: RE | Admit: 2021-04-22 | Discharge: 2021-04-22 | Disposition: A | Discharge status: Home / Self Care | Payer: 59 | Source: Ambulatory Visit | Attending: Obstetrics and Gynecology | Admitting: Obstetrics and Gynecology

## 2021-04-22 ENCOUNTER — Other Ambulatory Visit: Payer: Self-pay

## 2021-04-22 DIAGNOSIS — Z803 Family history of malignant neoplasm of breast: Secondary | ICD-10-CM | POA: Diagnosis not present

## 2021-04-22 MED ORDER — GADOBUTROL 1 MMOL/ML IV SOLN
6.0000 mL | Freq: Once | INTRAVENOUS | Status: AC | PRN
  Administered 2021-04-22: 6 mL via INTRAVENOUS

## 2021-05-26 DIAGNOSIS — M25561 Pain in right knee: Secondary | ICD-10-CM | POA: Diagnosis not present

## 2021-05-26 DIAGNOSIS — Z0000 Encounter for general adult medical examination without abnormal findings: Secondary | ICD-10-CM | POA: Diagnosis not present

## 2021-05-26 DIAGNOSIS — M25562 Pain in left knee: Secondary | ICD-10-CM | POA: Diagnosis not present

## 2021-07-04 ENCOUNTER — Other Ambulatory Visit (HOSPITAL_COMMUNITY): Payer: Self-pay

## 2021-07-05 ENCOUNTER — Other Ambulatory Visit (HOSPITAL_COMMUNITY): Payer: Self-pay

## 2021-07-05 MED ORDER — OMEPRAZOLE 40 MG PO CPDR
40.0000 mg | DELAYED_RELEASE_CAPSULE | Freq: Every day | ORAL | 0 refills | Status: AC
  Filled 2021-07-05: qty 30, 30d supply, fill #0

## 2021-10-13 DIAGNOSIS — D2239 Melanocytic nevi of other parts of face: Secondary | ICD-10-CM | POA: Diagnosis not present

## 2021-10-13 DIAGNOSIS — D485 Neoplasm of uncertain behavior of skin: Secondary | ICD-10-CM | POA: Diagnosis not present

## 2021-10-13 DIAGNOSIS — L570 Actinic keratosis: Secondary | ICD-10-CM | POA: Diagnosis not present

## 2021-10-13 DIAGNOSIS — L820 Inflamed seborrheic keratosis: Secondary | ICD-10-CM | POA: Diagnosis not present

## 2021-10-26 ENCOUNTER — Other Ambulatory Visit (HOSPITAL_BASED_OUTPATIENT_CLINIC_OR_DEPARTMENT_OTHER): Payer: Self-pay

## 2021-10-26 DIAGNOSIS — H5213 Myopia, bilateral: Secondary | ICD-10-CM | POA: Diagnosis not present

## 2021-10-26 MED ORDER — INFLUENZA VAC SPLIT QUAD 0.5 ML IM SUSY
PREFILLED_SYRINGE | INTRAMUSCULAR | 0 refills | Status: AC
  Filled 2021-10-26: qty 0.5, 1d supply, fill #0

## 2021-11-09 ENCOUNTER — Other Ambulatory Visit (HOSPITAL_BASED_OUTPATIENT_CLINIC_OR_DEPARTMENT_OTHER): Payer: Self-pay

## 2021-11-09 DIAGNOSIS — Z01419 Encounter for gynecological examination (general) (routine) without abnormal findings: Secondary | ICD-10-CM | POA: Diagnosis not present

## 2021-11-09 DIAGNOSIS — Z6822 Body mass index (BMI) 22.0-22.9, adult: Secondary | ICD-10-CM | POA: Diagnosis not present

## 2021-11-09 DIAGNOSIS — Z1151 Encounter for screening for human papillomavirus (HPV): Secondary | ICD-10-CM | POA: Diagnosis not present

## 2021-11-09 DIAGNOSIS — Z124 Encounter for screening for malignant neoplasm of cervix: Secondary | ICD-10-CM | POA: Diagnosis not present

## 2021-11-09 MED ORDER — HYDROCORTISONE (PERIANAL) 2.5 % EX CREA
TOPICAL_CREAM | CUTANEOUS | 6 refills | Status: AC
  Filled 2021-11-09: qty 30, 30d supply, fill #0

## 2022-02-23 ENCOUNTER — Other Ambulatory Visit: Payer: Self-pay | Admitting: Obstetrics and Gynecology

## 2022-02-23 DIAGNOSIS — Z1231 Encounter for screening mammogram for malignant neoplasm of breast: Secondary | ICD-10-CM

## 2022-03-10 IMAGING — CT CT CARDIAC CORONARY ARTERY CALCIUM SCORE
2 of 3 series · 10 of 20 positions shown, 12 images · non-contrast
Comparison: None.

CLINICAL DATA: 50-year-old Caucasian female with family history of
heart disease.

EXAM:
CT CARDIAC CORONARY ARTERY CALCIUM SCORE
TECHNIQUE: Non-contrast imaging through the heart was performed using
prospective ECG gating. Image post processing was performed on an
independent workstation, allowing for quantitative analysis of the
heart and coronary arteries. Note that this exam targets the heart
and the chest was not imaged in its entirety.

[Series 3: calcium scoring 2.00 br40 bestdiast 69% axial · axial · 0.45mm/px · z∈[+1777,+1881]mm · 5 of 80 slices shown, 7 images]
[im 14/80  vessel]
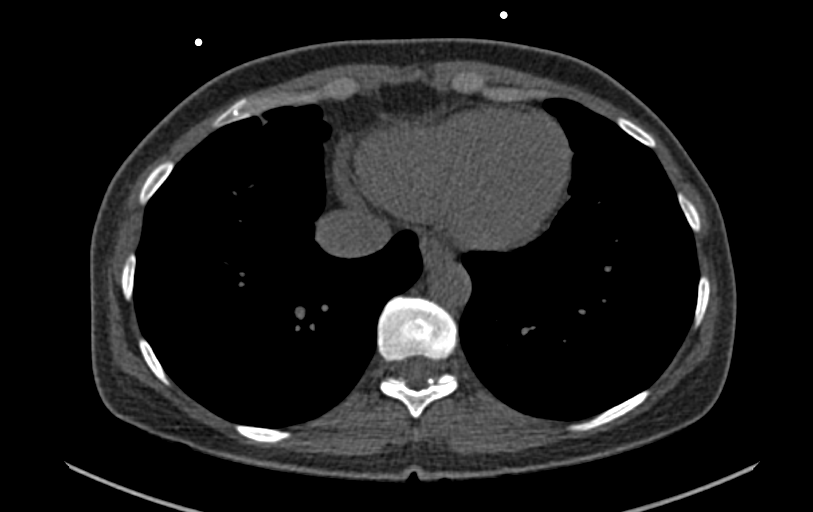
[im 14/80  lung]
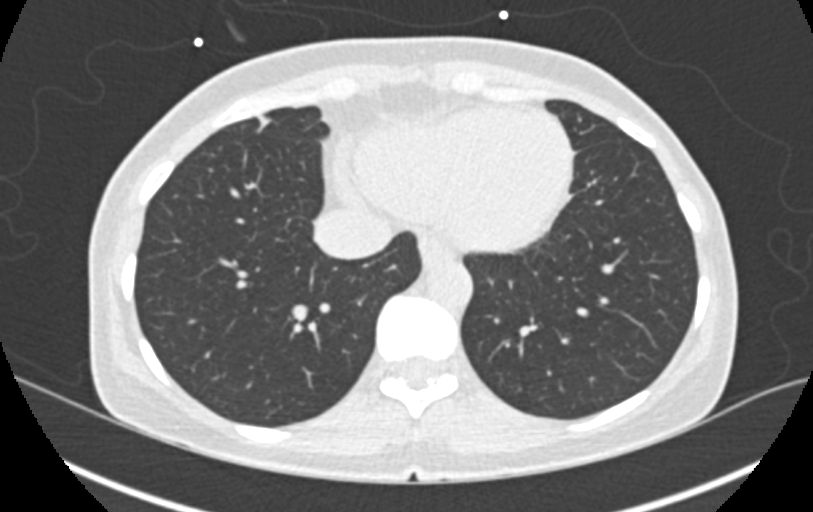
[im 27/80  vessel]
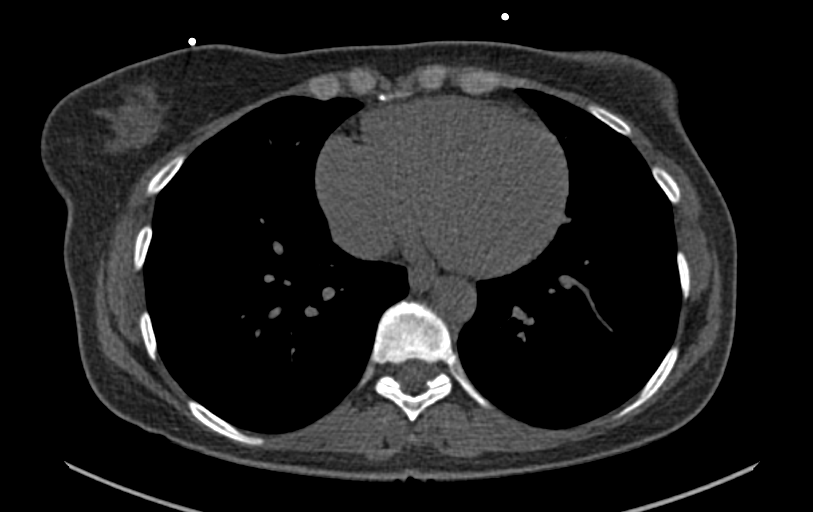
[im 40/80  vessel]
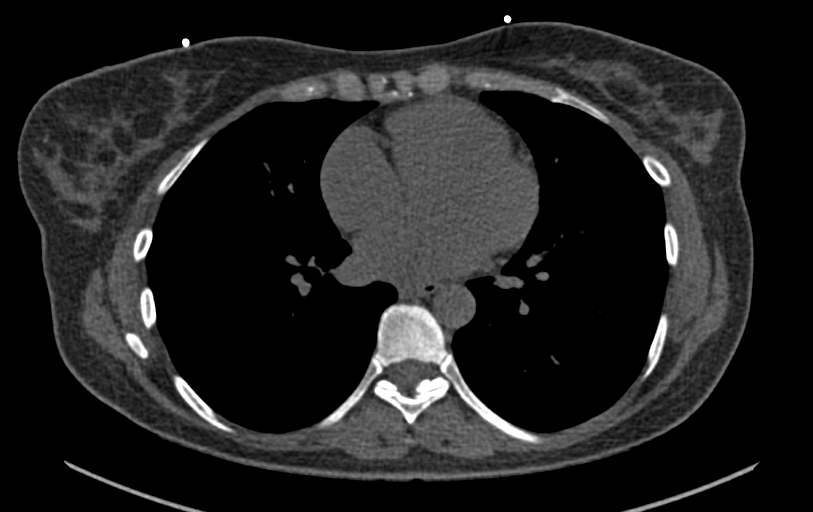
[im 53/80  vessel]
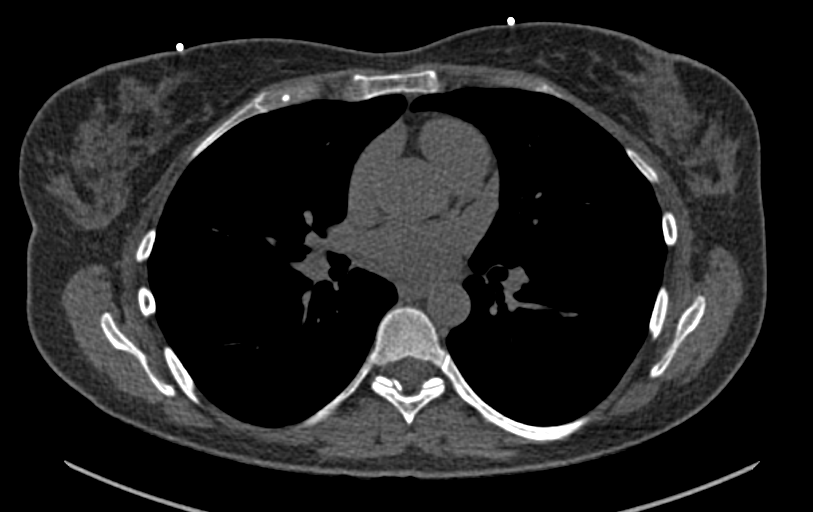
[im 66/80  vessel]
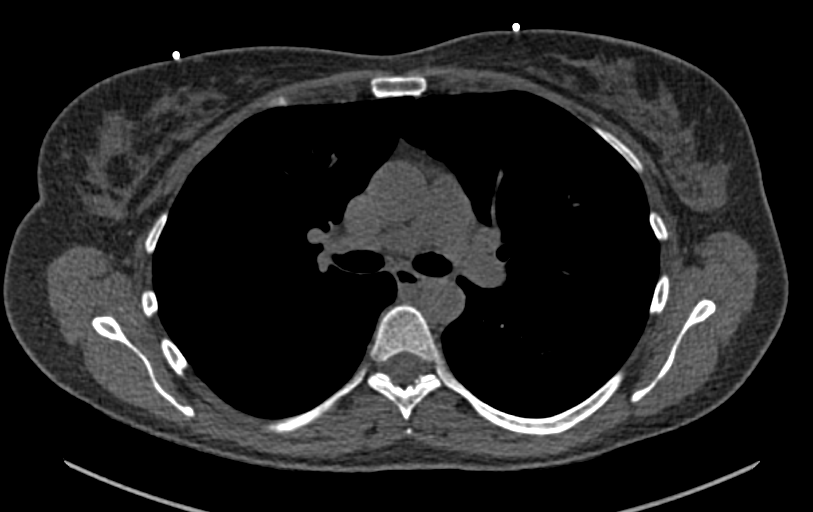
[im 66/80  lung]
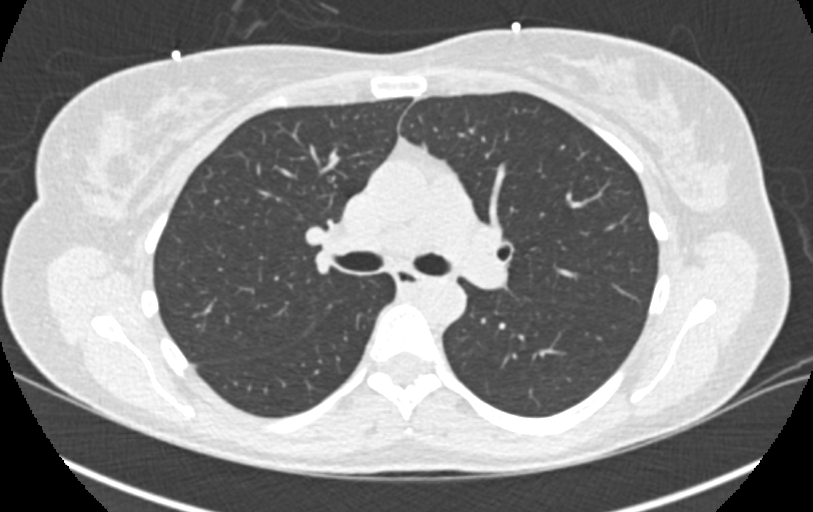

[Series 9: calcium scoring 2.00 br60 bestdiast 69% lungs · axial · 0.45mm/px · z∈[+1777,+1881]mm · 5 of 80 slices shown]
[im 14/80  vessel]
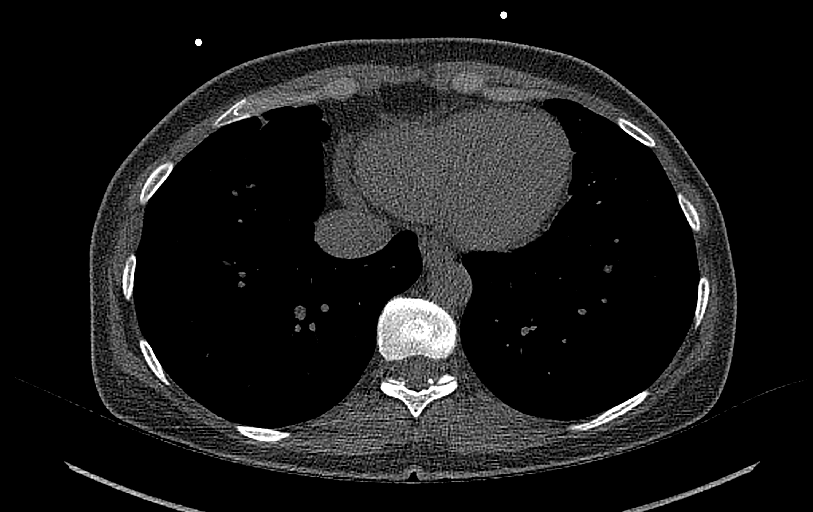
[im 27/80  vessel]
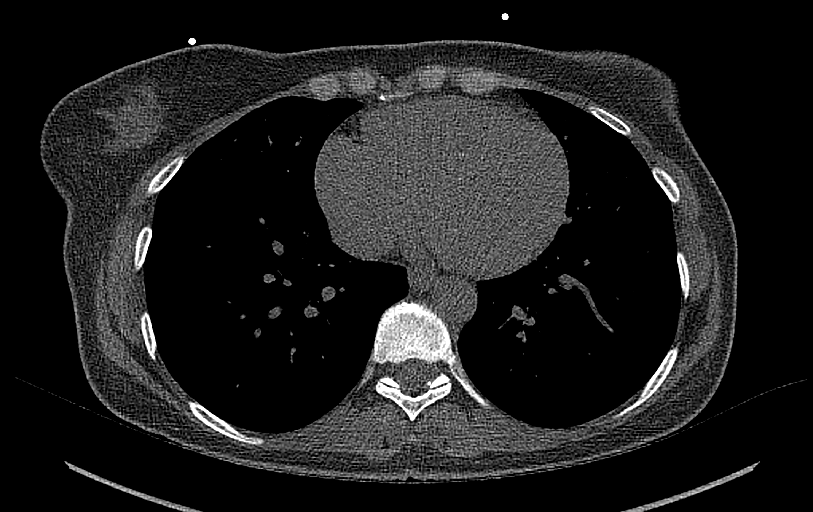
[im 40/80  vessel]
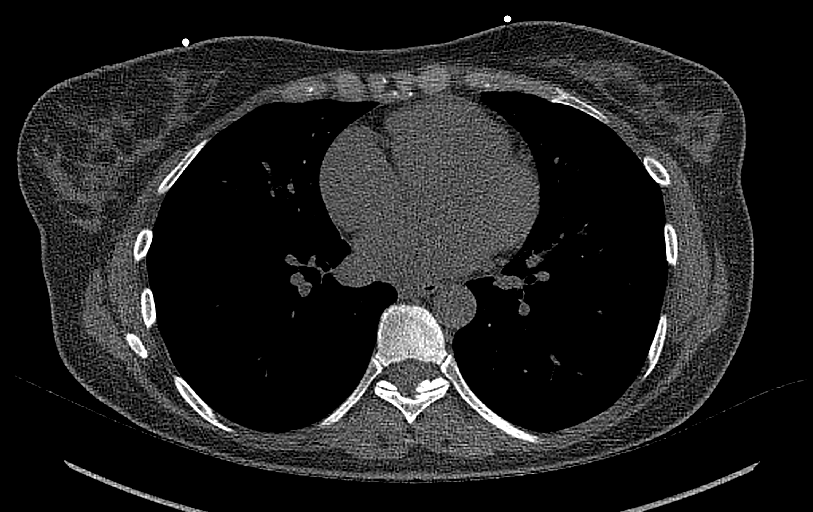
[im 53/80  vessel]
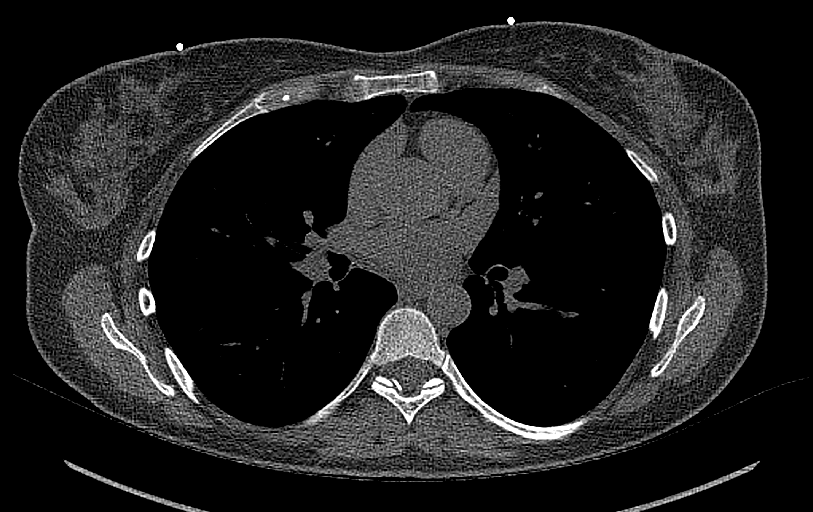
[im 66/80  vessel]
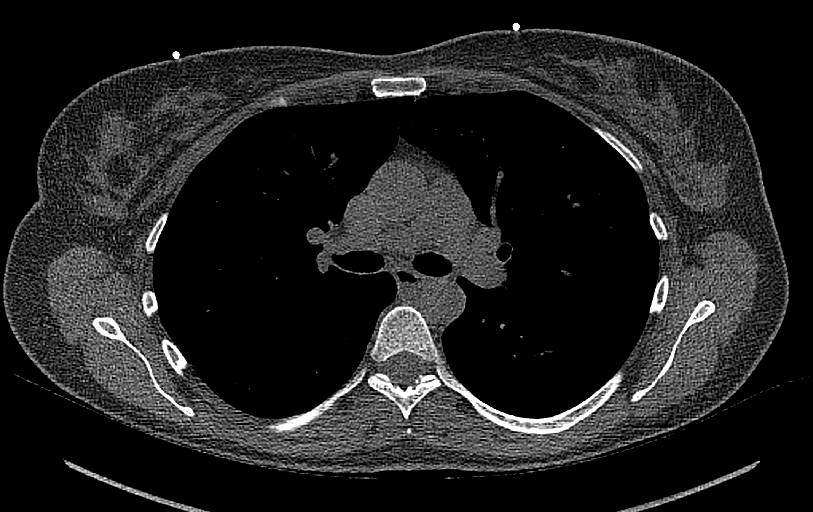

[10 of 20 positions shown; findings below may reference images not displayed]

FINDINGS: CORONARY CALCIUM SCORES:

Left Main: 0

LAD: 0

LCx: 0

RCA: 0

Total Agatston Score: 0

[HOSPITAL] percentile: 0

AORTA MEASUREMENTS:

Ascending Aorta: 28 mm

Descending Aorta: 19 mm

OTHER FINDINGS:

The heart size is within normal limits. No pericardial fluid is
identified. Visualized segments of the thoracic aorta and central
pulmonary arteries are normal in caliber. Visualized mediastinum and
hilar regions demonstrate no lymphadenopathy or masses.
Calcification of the medial lingula abutting the pericardium likely
relates to prior granulomatous disease or other prior inflammatory
process. Visualized lungs show no evidence of pulmonary edema,
consolidation, pneumothorax or pleural fluid. Visualized upper
abdomen and bony structures are unremarkable.
IMPRESSION: 1. Coronary calcium score of 0.
2. Calcification of the medial lingula also abutting the
pericardium. This appears benign and likely relates to prior
granulomatous disease or other prior inflammatory process.

## 2022-04-13 ENCOUNTER — Other Ambulatory Visit (HOSPITAL_BASED_OUTPATIENT_CLINIC_OR_DEPARTMENT_OTHER): Payer: Self-pay

## 2022-04-13 DIAGNOSIS — I781 Nevus, non-neoplastic: Secondary | ICD-10-CM | POA: Diagnosis not present

## 2022-04-13 DIAGNOSIS — D1800 Hemangioma unspecified site: Secondary | ICD-10-CM | POA: Diagnosis not present

## 2022-04-13 DIAGNOSIS — D225 Melanocytic nevi of trunk: Secondary | ICD-10-CM | POA: Diagnosis not present

## 2022-04-13 DIAGNOSIS — L820 Inflamed seborrheic keratosis: Secondary | ICD-10-CM | POA: Diagnosis not present

## 2022-04-13 DIAGNOSIS — B001 Herpesviral vesicular dermatitis: Secondary | ICD-10-CM | POA: Diagnosis not present

## 2022-04-13 DIAGNOSIS — L578 Other skin changes due to chronic exposure to nonionizing radiation: Secondary | ICD-10-CM | POA: Diagnosis not present

## 2022-04-13 DIAGNOSIS — D2221 Melanocytic nevi of right ear and external auricular canal: Secondary | ICD-10-CM | POA: Diagnosis not present

## 2022-04-13 DIAGNOSIS — D2271 Melanocytic nevi of right lower limb, including hip: Secondary | ICD-10-CM | POA: Diagnosis not present

## 2022-04-13 DIAGNOSIS — Z86018 Personal history of other benign neoplasm: Secondary | ICD-10-CM | POA: Diagnosis not present

## 2022-04-13 DIAGNOSIS — L570 Actinic keratosis: Secondary | ICD-10-CM | POA: Diagnosis not present

## 2022-04-13 MED ORDER — VALACYCLOVIR HCL 1 G PO TABS
ORAL_TABLET | ORAL | 3 refills | Status: AC
  Filled 2022-04-13 (×2): qty 8, 2d supply, fill #0

## 2022-04-14 ENCOUNTER — Ambulatory Visit
Admission: RE | Admit: 2022-04-14 | Discharge: 2022-04-14 | Disposition: A | Discharge status: Home / Self Care | Payer: Commercial Managed Care - PPO | Source: Ambulatory Visit | Attending: Obstetrics and Gynecology | Admitting: Obstetrics and Gynecology

## 2022-04-14 DIAGNOSIS — Z1231 Encounter for screening mammogram for malignant neoplasm of breast: Secondary | ICD-10-CM | POA: Diagnosis not present

## 2022-04-26 ENCOUNTER — Other Ambulatory Visit: Payer: Self-pay | Admitting: Obstetrics and Gynecology

## 2022-04-26 DIAGNOSIS — Z803 Family history of malignant neoplasm of breast: Secondary | ICD-10-CM

## 2022-05-22 ENCOUNTER — Other Ambulatory Visit (HOSPITAL_COMMUNITY): Payer: Self-pay

## 2022-05-22 MED ORDER — CYCLOBENZAPRINE HCL 10 MG PO TABS
10.0000 mg | ORAL_TABLET | Freq: Three times a day (TID) | ORAL | 0 refills | Status: AC
  Filled 2022-05-22: qty 30, 10d supply, fill #0

## 2022-05-25 DIAGNOSIS — Z0000 Encounter for general adult medical examination without abnormal findings: Secondary | ICD-10-CM | POA: Diagnosis not present

## 2022-05-25 DIAGNOSIS — Z13220 Encounter for screening for lipoid disorders: Secondary | ICD-10-CM | POA: Diagnosis not present

## 2022-05-25 DIAGNOSIS — E041 Nontoxic single thyroid nodule: Secondary | ICD-10-CM | POA: Diagnosis not present

## 2022-05-29 DIAGNOSIS — Z0000 Encounter for general adult medical examination without abnormal findings: Secondary | ICD-10-CM | POA: Diagnosis not present

## 2022-10-13 ENCOUNTER — Other Ambulatory Visit (HOSPITAL_BASED_OUTPATIENT_CLINIC_OR_DEPARTMENT_OTHER): Payer: Self-pay

## 2022-10-13 MED ORDER — COVID-19 MRNA VAC-TRIS(PFIZER) 30 MCG/0.3ML IM SUSY
0.3000 mL | PREFILLED_SYRINGE | Freq: Once | INTRAMUSCULAR | 0 refills | Status: AC
  Filled 2022-10-13: qty 0.3, 1d supply, fill #0

## 2022-10-15 ENCOUNTER — Other Ambulatory Visit: Payer: Commercial Managed Care - PPO

## 2022-10-27 ENCOUNTER — Other Ambulatory Visit (HOSPITAL_BASED_OUTPATIENT_CLINIC_OR_DEPARTMENT_OTHER): Payer: Self-pay

## 2022-10-27 ENCOUNTER — Encounter: Payer: Self-pay | Admitting: Obstetrics and Gynecology

## 2022-10-27 DIAGNOSIS — L723 Sebaceous cyst: Secondary | ICD-10-CM | POA: Diagnosis not present

## 2022-10-27 DIAGNOSIS — D485 Neoplasm of uncertain behavior of skin: Secondary | ICD-10-CM | POA: Diagnosis not present

## 2022-10-27 MED ORDER — INFLUENZA VIRUS VACC SPLIT PF (FLUZONE) 0.5 ML IM SUSY
0.5000 mL | PREFILLED_SYRINGE | Freq: Once | INTRAMUSCULAR | 0 refills | Status: AC
  Filled 2022-10-27: qty 0.5, 1d supply, fill #0

## 2022-10-31 ENCOUNTER — Ambulatory Visit
Admission: RE | Admit: 2022-10-31 | Discharge: 2022-10-31 | Disposition: A | Discharge status: Home / Self Care | Payer: Commercial Managed Care - PPO | Source: Ambulatory Visit | Attending: Obstetrics and Gynecology | Admitting: Obstetrics and Gynecology

## 2022-10-31 DIAGNOSIS — Z803 Family history of malignant neoplasm of breast: Secondary | ICD-10-CM | POA: Diagnosis not present

## 2022-10-31 MED ORDER — GADOPICLENOL 0.5 MMOL/ML IV SOLN
6.0000 mL | Freq: Once | INTRAVENOUS | Status: AC | PRN
  Administered 2022-10-31: 6 mL via INTRAVENOUS

## 2022-11-08 DIAGNOSIS — H5213 Myopia, bilateral: Secondary | ICD-10-CM | POA: Diagnosis not present

## 2022-11-13 DIAGNOSIS — Z1151 Encounter for screening for human papillomavirus (HPV): Secondary | ICD-10-CM | POA: Diagnosis not present

## 2022-11-13 DIAGNOSIS — Z01419 Encounter for gynecological examination (general) (routine) without abnormal findings: Secondary | ICD-10-CM | POA: Diagnosis not present

## 2022-11-13 DIAGNOSIS — Z6822 Body mass index (BMI) 22.0-22.9, adult: Secondary | ICD-10-CM | POA: Diagnosis not present

## 2022-11-13 DIAGNOSIS — Z124 Encounter for screening for malignant neoplasm of cervix: Secondary | ICD-10-CM | POA: Diagnosis not present

## 2023-03-01 ENCOUNTER — Other Ambulatory Visit: Payer: Self-pay | Admitting: Obstetrics and Gynecology

## 2023-03-01 DIAGNOSIS — Z1231 Encounter for screening mammogram for malignant neoplasm of breast: Secondary | ICD-10-CM

## 2023-04-17 ENCOUNTER — Ambulatory Visit
Admission: RE | Admit: 2023-04-17 | Discharge: 2023-04-17 | Disposition: A | Discharge status: Home / Self Care | Payer: Commercial Managed Care - PPO | Source: Ambulatory Visit | Attending: Obstetrics and Gynecology | Admitting: Obstetrics and Gynecology

## 2023-04-17 DIAGNOSIS — Z1231 Encounter for screening mammogram for malignant neoplasm of breast: Secondary | ICD-10-CM | POA: Diagnosis not present

## 2023-05-22 DIAGNOSIS — I781 Nevus, non-neoplastic: Secondary | ICD-10-CM | POA: Diagnosis not present

## 2023-05-22 DIAGNOSIS — Z86018 Personal history of other benign neoplasm: Secondary | ICD-10-CM | POA: Diagnosis not present

## 2023-05-22 DIAGNOSIS — D1800 Hemangioma unspecified site: Secondary | ICD-10-CM | POA: Diagnosis not present

## 2023-05-22 DIAGNOSIS — L578 Other skin changes due to chronic exposure to nonionizing radiation: Secondary | ICD-10-CM | POA: Diagnosis not present

## 2023-05-22 DIAGNOSIS — D225 Melanocytic nevi of trunk: Secondary | ICD-10-CM | POA: Diagnosis not present

## 2023-05-22 DIAGNOSIS — L719 Rosacea, unspecified: Secondary | ICD-10-CM | POA: Diagnosis not present

## 2023-05-22 DIAGNOSIS — D2221 Melanocytic nevi of right ear and external auricular canal: Secondary | ICD-10-CM | POA: Diagnosis not present

## 2023-05-22 DIAGNOSIS — D2272 Melanocytic nevi of left lower limb, including hip: Secondary | ICD-10-CM | POA: Diagnosis not present

## 2023-05-22 DIAGNOSIS — D2271 Melanocytic nevi of right lower limb, including hip: Secondary | ICD-10-CM | POA: Diagnosis not present

## 2023-06-04 DIAGNOSIS — N951 Menopausal and female climacteric states: Secondary | ICD-10-CM | POA: Diagnosis not present

## 2023-06-04 DIAGNOSIS — Z0000 Encounter for general adult medical examination without abnormal findings: Secondary | ICD-10-CM | POA: Diagnosis not present

## 2023-06-04 DIAGNOSIS — Z13220 Encounter for screening for lipoid disorders: Secondary | ICD-10-CM | POA: Diagnosis not present

## 2023-06-05 ENCOUNTER — Other Ambulatory Visit (HOSPITAL_BASED_OUTPATIENT_CLINIC_OR_DEPARTMENT_OTHER): Payer: Self-pay

## 2023-06-05 DIAGNOSIS — M25572 Pain in left ankle and joints of left foot: Secondary | ICD-10-CM | POA: Diagnosis not present

## 2023-06-05 MED ORDER — PREDNISONE 10 MG PO TABS
ORAL_TABLET | ORAL | 0 refills | Status: AC
  Filled 2023-06-05: qty 18, 9d supply, fill #0

## 2023-06-08 ENCOUNTER — Other Ambulatory Visit (HOSPITAL_BASED_OUTPATIENT_CLINIC_OR_DEPARTMENT_OTHER): Payer: Self-pay

## 2023-06-27 ENCOUNTER — Other Ambulatory Visit (HOSPITAL_BASED_OUTPATIENT_CLINIC_OR_DEPARTMENT_OTHER): Payer: Self-pay

## 2023-06-27 MED ORDER — OMEPRAZOLE 40 MG PO CPDR
40.0000 mg | DELAYED_RELEASE_CAPSULE | Freq: Every day | ORAL | 3 refills | Status: DC
  Filled 2023-06-27: qty 30, 30d supply, fill #0
  Filled 2023-07-26: qty 90, 90d supply, fill #1

## 2023-07-03 DIAGNOSIS — Z0000 Encounter for general adult medical examination without abnormal findings: Secondary | ICD-10-CM | POA: Diagnosis not present

## 2023-07-03 DIAGNOSIS — Z13220 Encounter for screening for lipoid disorders: Secondary | ICD-10-CM | POA: Diagnosis not present

## 2023-07-26 ENCOUNTER — Other Ambulatory Visit (HOSPITAL_BASED_OUTPATIENT_CLINIC_OR_DEPARTMENT_OTHER): Payer: Self-pay

## 2023-08-08 ENCOUNTER — Other Ambulatory Visit: Payer: Self-pay | Admitting: Obstetrics and Gynecology

## 2023-08-08 DIAGNOSIS — Z803 Family history of malignant neoplasm of breast: Secondary | ICD-10-CM

## 2023-10-18 ENCOUNTER — Other Ambulatory Visit (HOSPITAL_BASED_OUTPATIENT_CLINIC_OR_DEPARTMENT_OTHER): Payer: Self-pay

## 2023-10-18 MED ORDER — OMEPRAZOLE 40 MG PO CPDR
40.0000 mg | DELAYED_RELEASE_CAPSULE | Freq: Every morning | ORAL | 3 refills | Status: DC
  Filled 2023-10-18: qty 30, 30d supply, fill #0
  Filled 2023-11-21: qty 90, 90d supply, fill #1

## 2023-10-25 ENCOUNTER — Other Ambulatory Visit (HOSPITAL_BASED_OUTPATIENT_CLINIC_OR_DEPARTMENT_OTHER): Payer: Self-pay

## 2023-10-30 ENCOUNTER — Encounter: Payer: Self-pay | Admitting: Obstetrics and Gynecology

## 2023-11-07 ENCOUNTER — Ambulatory Visit
Admission: RE | Admit: 2023-11-07 | Discharge: 2023-11-07 | Disposition: A | Discharge status: Home / Self Care | Source: Ambulatory Visit | Attending: Obstetrics and Gynecology | Admitting: Obstetrics and Gynecology

## 2023-11-07 DIAGNOSIS — Z853 Personal history of malignant neoplasm of breast: Secondary | ICD-10-CM | POA: Diagnosis not present

## 2023-11-07 DIAGNOSIS — Z803 Family history of malignant neoplasm of breast: Secondary | ICD-10-CM

## 2023-11-07 MED ORDER — GADOPICLENOL 0.5 MMOL/ML IV SOLN
6.0000 mL | Freq: Once | INTRAVENOUS | Status: AC | PRN
  Administered 2023-11-07: 6 mL via INTRAVENOUS

## 2023-11-08 ENCOUNTER — Other Ambulatory Visit: Payer: Self-pay | Admitting: Obstetrics and Gynecology

## 2023-11-08 ENCOUNTER — Encounter: Payer: Self-pay | Admitting: Obstetrics and Gynecology

## 2023-11-08 ENCOUNTER — Other Ambulatory Visit (HOSPITAL_BASED_OUTPATIENT_CLINIC_OR_DEPARTMENT_OTHER): Payer: Self-pay

## 2023-11-08 DIAGNOSIS — R9389 Abnormal findings on diagnostic imaging of other specified body structures: Secondary | ICD-10-CM

## 2023-11-08 MED ORDER — ALPRAZOLAM 0.25 MG PO TABS
0.2500 mg | ORAL_TABLET | Freq: Every day | ORAL | 0 refills | Status: AC | PRN
  Filled 2023-11-08: qty 1, 1d supply, fill #0

## 2023-11-14 DIAGNOSIS — H524 Presbyopia: Secondary | ICD-10-CM | POA: Diagnosis not present

## 2023-11-14 DIAGNOSIS — H5213 Myopia, bilateral: Secondary | ICD-10-CM | POA: Diagnosis not present

## 2023-11-15 ENCOUNTER — Ambulatory Visit
Admission: RE | Admit: 2023-11-15 | Discharge: 2023-11-15 | Disposition: A | Discharge status: Home / Self Care | Source: Ambulatory Visit | Attending: Obstetrics and Gynecology | Admitting: Obstetrics and Gynecology

## 2023-11-15 DIAGNOSIS — N6325 Unspecified lump in the left breast, overlapping quadrants: Secondary | ICD-10-CM | POA: Diagnosis not present

## 2023-11-15 DIAGNOSIS — R9389 Abnormal findings on diagnostic imaging of other specified body structures: Secondary | ICD-10-CM

## 2023-11-15 DIAGNOSIS — N6022 Fibroadenosis of left breast: Secondary | ICD-10-CM | POA: Diagnosis not present

## 2023-11-15 DIAGNOSIS — R928 Other abnormal and inconclusive findings on diagnostic imaging of breast: Secondary | ICD-10-CM | POA: Diagnosis not present

## 2023-11-15 DIAGNOSIS — N6012 Diffuse cystic mastopathy of left breast: Secondary | ICD-10-CM | POA: Diagnosis not present

## 2023-11-15 MED ORDER — GADOPICLENOL 0.5 MMOL/ML IV SOLN
6.0000 mL | Freq: Once | INTRAVENOUS | Status: AC | PRN
  Administered 2023-11-15: 6 mL via INTRAVENOUS

## 2023-11-16 LAB — SURGICAL PATHOLOGY

## 2023-11-17 ENCOUNTER — Other Ambulatory Visit (HOSPITAL_BASED_OUTPATIENT_CLINIC_OR_DEPARTMENT_OTHER): Payer: Self-pay

## 2023-11-17 MED ORDER — FLUZONE 0.5 ML IM SUSY
0.5000 mL | PREFILLED_SYRINGE | Freq: Once | INTRAMUSCULAR | 0 refills | Status: AC
  Filled 2023-11-17: qty 0.5, 1d supply, fill #0

## 2023-11-21 ENCOUNTER — Other Ambulatory Visit (HOSPITAL_BASED_OUTPATIENT_CLINIC_OR_DEPARTMENT_OTHER): Payer: Self-pay

## 2023-11-21 DIAGNOSIS — Z1151 Encounter for screening for human papillomavirus (HPV): Secondary | ICD-10-CM | POA: Diagnosis not present

## 2023-11-21 DIAGNOSIS — Z124 Encounter for screening for malignant neoplasm of cervix: Secondary | ICD-10-CM | POA: Diagnosis not present

## 2023-11-21 DIAGNOSIS — Z6822 Body mass index (BMI) 22.0-22.9, adult: Secondary | ICD-10-CM | POA: Diagnosis not present

## 2023-11-21 DIAGNOSIS — Z01419 Encounter for gynecological examination (general) (routine) without abnormal findings: Secondary | ICD-10-CM | POA: Diagnosis not present

## 2024-01-11 ENCOUNTER — Other Ambulatory Visit (HOSPITAL_BASED_OUTPATIENT_CLINIC_OR_DEPARTMENT_OTHER): Payer: Self-pay

## 2024-01-11 MED ORDER — COMIRNATY 30 MCG/0.3ML IM SUSY
0.3000 mL | PREFILLED_SYRINGE | Freq: Once | INTRAMUSCULAR | 0 refills | Status: AC
  Filled 2024-01-11: qty 0.3, 1d supply, fill #0

## 2024-02-19 ENCOUNTER — Other Ambulatory Visit (HOSPITAL_BASED_OUTPATIENT_CLINIC_OR_DEPARTMENT_OTHER): Payer: Self-pay

## 2024-02-20 ENCOUNTER — Other Ambulatory Visit (HOSPITAL_BASED_OUTPATIENT_CLINIC_OR_DEPARTMENT_OTHER): Payer: Self-pay

## 2024-02-20 MED ORDER — OMEPRAZOLE 40 MG PO CPDR
40.0000 mg | DELAYED_RELEASE_CAPSULE | ORAL | 4 refills | Status: AC
  Filled 2024-02-20: qty 30, 30d supply, fill #0
